# Patient Record
Sex: Female | Born: 1950
Health system: Southern US, Community
[De-identification: ages and names within clinical notes are randomized; demographics above are authoritative.]

## PROBLEM LIST (undated history)

## (undated) DIAGNOSIS — G4730 Sleep apnea, unspecified: Secondary | ICD-10-CM

## (undated) DIAGNOSIS — T7840XA Allergy, unspecified, initial encounter: Secondary | ICD-10-CM

## (undated) DIAGNOSIS — I10 Essential (primary) hypertension: Secondary | ICD-10-CM

## (undated) DIAGNOSIS — D649 Anemia, unspecified: Secondary | ICD-10-CM

## (undated) DIAGNOSIS — E119 Type 2 diabetes mellitus without complications: Secondary | ICD-10-CM

## (undated) DIAGNOSIS — E785 Hyperlipidemia, unspecified: Secondary | ICD-10-CM

## (undated) HISTORY — DX: Allergy, unspecified, initial encounter: T78.40XA

## (undated) HISTORY — DX: Sleep apnea, unspecified: G47.30

## (undated) HISTORY — DX: Essential (primary) hypertension: I10

## (undated) HISTORY — DX: Anemia, unspecified: D64.9

## (undated) HISTORY — DX: Type 2 diabetes mellitus without complications: E11.9

## (undated) HISTORY — DX: Hyperlipidemia, unspecified: E78.5

## (undated) HISTORY — PX: OTHER SURGICAL HISTORY: SHX169

## (undated) HISTORY — PX: ABDOMINAL HYSTERECTOMY: SHX81

---

## 2001-03-03 ENCOUNTER — Emergency Department (HOSPITAL_COMMUNITY): Admission: EM | Admit: 2001-03-03 | Discharge: 2001-03-03 | Payer: Self-pay | Admitting: *Deleted

## 2001-11-14 ENCOUNTER — Ambulatory Visit (HOSPITAL_COMMUNITY): Admission: RE | Admit: 2001-11-14 | Discharge: 2001-11-14 | Payer: Self-pay | Admitting: General Surgery

## 2001-11-14 ENCOUNTER — Encounter: Payer: Self-pay | Admitting: General Surgery

## 2001-11-27 ENCOUNTER — Encounter: Payer: Self-pay | Admitting: General Surgery

## 2001-11-27 ENCOUNTER — Ambulatory Visit (HOSPITAL_COMMUNITY): Admission: RE | Admit: 2001-11-27 | Discharge: 2001-11-27 | Payer: Self-pay | Admitting: General Surgery

## 2002-04-19 ENCOUNTER — Encounter: Payer: Self-pay | Admitting: General Surgery

## 2002-04-19 ENCOUNTER — Ambulatory Visit (HOSPITAL_COMMUNITY): Admission: RE | Admit: 2002-04-19 | Discharge: 2002-04-19 | Payer: Self-pay | Admitting: General Surgery

## 2003-08-15 ENCOUNTER — Inpatient Hospital Stay (HOSPITAL_COMMUNITY): Admission: RE | Admit: 2003-08-15 | Discharge: 2003-08-18 | Payer: Self-pay | Admitting: General Surgery

## 2003-08-18 ENCOUNTER — Encounter (INDEPENDENT_AMBULATORY_CARE_PROVIDER_SITE_OTHER): Payer: Self-pay | Admitting: Internal Medicine

## 2003-09-02 ENCOUNTER — Ambulatory Visit (HOSPITAL_COMMUNITY): Admission: RE | Admit: 2003-09-02 | Discharge: 2003-09-02 | Payer: Self-pay | Admitting: General Surgery

## 2003-09-08 ENCOUNTER — Encounter (INDEPENDENT_AMBULATORY_CARE_PROVIDER_SITE_OTHER): Payer: Self-pay | Admitting: Internal Medicine

## 2003-09-08 ENCOUNTER — Ambulatory Visit (HOSPITAL_COMMUNITY): Admission: RE | Admit: 2003-09-08 | Discharge: 2003-09-08 | Payer: Self-pay | Admitting: General Surgery

## 2003-10-10 ENCOUNTER — Encounter (INDEPENDENT_AMBULATORY_CARE_PROVIDER_SITE_OTHER): Payer: Self-pay | Admitting: Internal Medicine

## 2004-05-09 ENCOUNTER — Emergency Department (HOSPITAL_COMMUNITY): Admission: EM | Admit: 2004-05-09 | Discharge: 2004-05-09 | Payer: Self-pay | Admitting: Emergency Medicine

## 2005-05-05 ENCOUNTER — Encounter (INDEPENDENT_AMBULATORY_CARE_PROVIDER_SITE_OTHER): Payer: Self-pay | Admitting: Internal Medicine

## 2005-05-05 ENCOUNTER — Ambulatory Visit (HOSPITAL_COMMUNITY): Admission: RE | Admit: 2005-05-05 | Discharge: 2005-05-05 | Payer: Self-pay | Admitting: General Surgery

## 2005-06-01 ENCOUNTER — Encounter (INDEPENDENT_AMBULATORY_CARE_PROVIDER_SITE_OTHER): Payer: Self-pay | Admitting: Internal Medicine

## 2005-06-01 LAB — CONVERTED CEMR LAB
Blood Glucose, Fasting: 115 mg/dL
Pap Smear: NORMAL

## 2005-06-20 ENCOUNTER — Encounter (INDEPENDENT_AMBULATORY_CARE_PROVIDER_SITE_OTHER): Payer: Self-pay | Admitting: Internal Medicine

## 2005-08-29 ENCOUNTER — Encounter (HOSPITAL_COMMUNITY): Admission: RE | Admit: 2005-08-29 | Discharge: 2005-09-28 | Payer: Self-pay | Admitting: General Surgery

## 2005-08-29 ENCOUNTER — Encounter (INDEPENDENT_AMBULATORY_CARE_PROVIDER_SITE_OTHER): Payer: Self-pay | Admitting: Internal Medicine

## 2005-09-18 ENCOUNTER — Emergency Department (HOSPITAL_COMMUNITY): Admission: EM | Admit: 2005-09-18 | Discharge: 2005-09-18 | Payer: Self-pay | Admitting: Emergency Medicine

## 2006-04-19 ENCOUNTER — Ambulatory Visit: Payer: Self-pay | Admitting: Internal Medicine

## 2006-05-17 ENCOUNTER — Ambulatory Visit: Payer: Self-pay | Admitting: Internal Medicine

## 2006-05-17 DIAGNOSIS — N6019 Diffuse cystic mastopathy of unspecified breast: Secondary | ICD-10-CM

## 2006-05-18 ENCOUNTER — Encounter (INDEPENDENT_AMBULATORY_CARE_PROVIDER_SITE_OTHER): Payer: Self-pay | Admitting: Internal Medicine

## 2006-05-23 ENCOUNTER — Encounter (INDEPENDENT_AMBULATORY_CARE_PROVIDER_SITE_OTHER): Payer: Self-pay | Admitting: Internal Medicine

## 2006-05-23 ENCOUNTER — Ambulatory Visit (HOSPITAL_COMMUNITY): Admission: RE | Admit: 2006-05-23 | Discharge: 2006-05-23 | Payer: Self-pay | Admitting: Internal Medicine

## 2006-06-01 ENCOUNTER — Encounter (INDEPENDENT_AMBULATORY_CARE_PROVIDER_SITE_OTHER): Payer: Self-pay | Admitting: Internal Medicine

## 2006-06-02 ENCOUNTER — Encounter (INDEPENDENT_AMBULATORY_CARE_PROVIDER_SITE_OTHER): Payer: Self-pay | Admitting: Internal Medicine

## 2006-06-12 ENCOUNTER — Telehealth (INDEPENDENT_AMBULATORY_CARE_PROVIDER_SITE_OTHER): Payer: Self-pay | Admitting: Internal Medicine

## 2006-06-12 ENCOUNTER — Encounter (INDEPENDENT_AMBULATORY_CARE_PROVIDER_SITE_OTHER): Payer: Self-pay | Admitting: Internal Medicine

## 2006-06-15 ENCOUNTER — Ambulatory Visit (HOSPITAL_COMMUNITY): Admission: RE | Admit: 2006-06-15 | Discharge: 2006-06-15 | Payer: Self-pay | Admitting: Internal Medicine

## 2006-06-15 ENCOUNTER — Encounter (INDEPENDENT_AMBULATORY_CARE_PROVIDER_SITE_OTHER): Payer: Self-pay | Admitting: Internal Medicine

## 2006-06-20 ENCOUNTER — Encounter: Payer: Self-pay | Admitting: Internal Medicine

## 2006-06-20 DIAGNOSIS — M545 Low back pain: Secondary | ICD-10-CM

## 2006-06-20 DIAGNOSIS — R32 Unspecified urinary incontinence: Secondary | ICD-10-CM

## 2006-06-20 DIAGNOSIS — M129 Arthropathy, unspecified: Secondary | ICD-10-CM | POA: Insufficient documentation

## 2006-06-20 DIAGNOSIS — J309 Allergic rhinitis, unspecified: Secondary | ICD-10-CM

## 2006-07-05 ENCOUNTER — Ambulatory Visit: Payer: Self-pay | Admitting: Internal Medicine

## 2006-07-05 ENCOUNTER — Other Ambulatory Visit: Admission: RE | Admit: 2006-07-05 | Discharge: 2006-07-05 | Payer: Self-pay | Admitting: Internal Medicine

## 2006-07-05 ENCOUNTER — Encounter (INDEPENDENT_AMBULATORY_CARE_PROVIDER_SITE_OTHER): Payer: Self-pay | Admitting: Specialist

## 2006-07-05 DIAGNOSIS — M8580 Other specified disorders of bone density and structure, unspecified site: Secondary | ICD-10-CM | POA: Insufficient documentation

## 2006-07-05 DIAGNOSIS — M810 Age-related osteoporosis without current pathological fracture: Secondary | ICD-10-CM

## 2006-07-19 ENCOUNTER — Ambulatory Visit: Payer: Self-pay | Admitting: Internal Medicine

## 2006-07-21 ENCOUNTER — Encounter (INDEPENDENT_AMBULATORY_CARE_PROVIDER_SITE_OTHER): Payer: Self-pay | Admitting: Internal Medicine

## 2006-08-02 ENCOUNTER — Ambulatory Visit: Payer: Self-pay | Admitting: Internal Medicine

## 2006-08-03 ENCOUNTER — Inpatient Hospital Stay (HOSPITAL_COMMUNITY): Admission: EM | Admit: 2006-08-03 | Discharge: 2006-08-05 | Payer: Self-pay | Admitting: Emergency Medicine

## 2006-08-07 ENCOUNTER — Ambulatory Visit: Payer: Self-pay | Admitting: Internal Medicine

## 2006-08-07 ENCOUNTER — Telehealth (INDEPENDENT_AMBULATORY_CARE_PROVIDER_SITE_OTHER): Payer: Self-pay | Admitting: Internal Medicine

## 2006-08-16 ENCOUNTER — Ambulatory Visit: Payer: Self-pay | Admitting: Internal Medicine

## 2006-08-29 ENCOUNTER — Encounter (INDEPENDENT_AMBULATORY_CARE_PROVIDER_SITE_OTHER): Payer: Self-pay | Admitting: Internal Medicine

## 2006-09-21 ENCOUNTER — Ambulatory Visit: Payer: Self-pay | Admitting: Internal Medicine

## 2006-09-29 ENCOUNTER — Encounter (INDEPENDENT_AMBULATORY_CARE_PROVIDER_SITE_OTHER): Payer: Self-pay | Admitting: Internal Medicine

## 2006-11-03 ENCOUNTER — Ambulatory Visit: Payer: Self-pay | Admitting: Internal Medicine

## 2006-11-03 DIAGNOSIS — E210 Primary hyperparathyroidism: Secondary | ICD-10-CM | POA: Insufficient documentation

## 2006-11-04 ENCOUNTER — Encounter (INDEPENDENT_AMBULATORY_CARE_PROVIDER_SITE_OTHER): Payer: Self-pay | Admitting: Internal Medicine

## 2006-11-08 ENCOUNTER — Encounter (INDEPENDENT_AMBULATORY_CARE_PROVIDER_SITE_OTHER): Payer: Self-pay | Admitting: Internal Medicine

## 2006-11-21 LAB — CONVERTED CEMR LAB
BUN: 10 mg/dL (ref 6–23)
CO2: 25 meq/L (ref 19–32)
Chloride: 110 meq/L (ref 96–112)
Creatinine, Ser: 0.83 mg/dL (ref 0.40–1.20)
Glucose, Bld: 105 mg/dL — ABNORMAL HIGH (ref 70–99)
Potassium: 4.2 meq/L (ref 3.5–5.3)

## 2006-12-06 ENCOUNTER — Encounter (INDEPENDENT_AMBULATORY_CARE_PROVIDER_SITE_OTHER): Payer: Self-pay | Admitting: Internal Medicine

## 2006-12-06 ENCOUNTER — Telehealth (INDEPENDENT_AMBULATORY_CARE_PROVIDER_SITE_OTHER): Payer: Self-pay | Admitting: *Deleted

## 2006-12-07 ENCOUNTER — Encounter (INDEPENDENT_AMBULATORY_CARE_PROVIDER_SITE_OTHER): Payer: Self-pay | Admitting: Internal Medicine

## 2006-12-22 ENCOUNTER — Ambulatory Visit: Payer: Self-pay | Admitting: Internal Medicine

## 2006-12-22 DIAGNOSIS — R5381 Other malaise: Secondary | ICD-10-CM | POA: Insufficient documentation

## 2006-12-22 DIAGNOSIS — R5383 Other fatigue: Secondary | ICD-10-CM

## 2006-12-28 ENCOUNTER — Encounter (INDEPENDENT_AMBULATORY_CARE_PROVIDER_SITE_OTHER): Payer: Self-pay | Admitting: Internal Medicine

## 2006-12-28 LAB — CONVERTED CEMR LAB
BUN: 10 mg/dL (ref 6–23)
Basophils Relative: 0 % (ref 0–1)
Creatinine, Ser: 0.73 mg/dL (ref 0.40–1.20)
Eosinophils Absolute: 0.2 10*3/uL (ref 0.0–0.7)
Glucose, Bld: 109 mg/dL — ABNORMAL HIGH (ref 70–99)
HCT: 35.7 % — ABNORMAL LOW (ref 36.0–46.0)
Hemoglobin: 12.1 g/dL (ref 12.0–15.0)
Lymphs Abs: 2.8 10*3/uL (ref 0.7–3.3)
MCHC: 34 g/dL (ref 30.0–36.0)
MCV: 83.4 fL (ref 78.0–100.0)
Monocytes Absolute: 0.4 10*3/uL (ref 0.2–0.7)
Monocytes Relative: 7 % (ref 3–11)
Potassium: 4 meq/L (ref 3.5–5.3)
RBC: 4.28 M/uL (ref 3.87–5.11)
TSH: 1.581 microintl units/mL (ref 0.350–5.50)
WBC: 5.7 10*3/uL (ref 4.0–10.5)

## 2007-02-15 ENCOUNTER — Encounter (HOSPITAL_COMMUNITY): Admission: RE | Admit: 2007-02-15 | Discharge: 2007-03-17 | Payer: Self-pay | Admitting: Otolaryngology

## 2007-03-06 ENCOUNTER — Emergency Department (HOSPITAL_COMMUNITY): Admission: EM | Admit: 2007-03-06 | Discharge: 2007-03-06 | Payer: Self-pay | Admitting: *Deleted

## 2007-03-08 ENCOUNTER — Encounter (INDEPENDENT_AMBULATORY_CARE_PROVIDER_SITE_OTHER): Payer: Self-pay | Admitting: Internal Medicine

## 2007-03-12 ENCOUNTER — Ambulatory Visit: Payer: Self-pay | Admitting: Internal Medicine

## 2007-03-12 DIAGNOSIS — D649 Anemia, unspecified: Secondary | ICD-10-CM

## 2007-03-12 LAB — CONVERTED CEMR LAB
Blood Glucose, AC Bkfst: 120 mg/dL
Ferritin: 186 ng/mL (ref 10–291)
TIBC: 295 ug/dL (ref 250–470)
UIBC: 251 ug/dL
Vitamin B-12: 444 pg/mL (ref 211–911)

## 2007-03-21 ENCOUNTER — Ambulatory Visit: Payer: Self-pay | Admitting: Internal Medicine

## 2007-04-04 ENCOUNTER — Encounter (INDEPENDENT_AMBULATORY_CARE_PROVIDER_SITE_OTHER): Payer: Self-pay | Admitting: Internal Medicine

## 2007-04-16 ENCOUNTER — Encounter (INDEPENDENT_AMBULATORY_CARE_PROVIDER_SITE_OTHER): Payer: Self-pay | Admitting: Internal Medicine

## 2007-04-20 ENCOUNTER — Ambulatory Visit: Payer: Self-pay | Admitting: Internal Medicine

## 2007-04-20 ENCOUNTER — Telehealth (INDEPENDENT_AMBULATORY_CARE_PROVIDER_SITE_OTHER): Payer: Self-pay | Admitting: *Deleted

## 2007-04-20 DIAGNOSIS — G4733 Obstructive sleep apnea (adult) (pediatric): Secondary | ICD-10-CM

## 2007-04-23 ENCOUNTER — Encounter (INDEPENDENT_AMBULATORY_CARE_PROVIDER_SITE_OTHER): Payer: Self-pay | Admitting: Internal Medicine

## 2007-04-23 ENCOUNTER — Telehealth (INDEPENDENT_AMBULATORY_CARE_PROVIDER_SITE_OTHER): Payer: Self-pay | Admitting: *Deleted

## 2007-04-24 ENCOUNTER — Ambulatory Visit (HOSPITAL_COMMUNITY): Admission: RE | Admit: 2007-04-24 | Discharge: 2007-04-24 | Payer: Self-pay | Admitting: Internal Medicine

## 2007-04-26 ENCOUNTER — Telehealth (INDEPENDENT_AMBULATORY_CARE_PROVIDER_SITE_OTHER): Payer: Self-pay | Admitting: Internal Medicine

## 2007-05-01 ENCOUNTER — Encounter (INDEPENDENT_AMBULATORY_CARE_PROVIDER_SITE_OTHER): Payer: Self-pay | Admitting: Internal Medicine

## 2007-05-02 ENCOUNTER — Telehealth (INDEPENDENT_AMBULATORY_CARE_PROVIDER_SITE_OTHER): Payer: Self-pay | Admitting: *Deleted

## 2007-05-09 ENCOUNTER — Encounter: Admission: RE | Admit: 2007-05-09 | Discharge: 2007-05-09 | Payer: Self-pay | Admitting: Internal Medicine

## 2007-05-21 ENCOUNTER — Ambulatory Visit: Payer: Self-pay | Admitting: Internal Medicine

## 2007-05-21 ENCOUNTER — Telehealth (INDEPENDENT_AMBULATORY_CARE_PROVIDER_SITE_OTHER): Payer: Self-pay | Admitting: *Deleted

## 2007-05-22 ENCOUNTER — Ambulatory Visit (HOSPITAL_COMMUNITY): Admission: RE | Admit: 2007-05-22 | Discharge: 2007-05-22 | Payer: Self-pay | Admitting: Otolaryngology

## 2007-05-23 ENCOUNTER — Encounter: Admission: RE | Admit: 2007-05-23 | Discharge: 2007-05-23 | Payer: Self-pay | Admitting: Internal Medicine

## 2007-05-24 ENCOUNTER — Encounter (INDEPENDENT_AMBULATORY_CARE_PROVIDER_SITE_OTHER): Payer: Self-pay | Admitting: Internal Medicine

## 2007-05-25 ENCOUNTER — Telehealth (INDEPENDENT_AMBULATORY_CARE_PROVIDER_SITE_OTHER): Payer: Self-pay | Admitting: *Deleted

## 2007-05-25 LAB — CONVERTED CEMR LAB: Glucose, Fasting: 135 mg/dL — ABNORMAL HIGH (ref 70–99)

## 2007-05-28 ENCOUNTER — Ambulatory Visit (HOSPITAL_COMMUNITY): Admission: RE | Admit: 2007-05-28 | Discharge: 2007-05-28 | Payer: Self-pay | Admitting: Internal Medicine

## 2007-05-28 ENCOUNTER — Encounter (INDEPENDENT_AMBULATORY_CARE_PROVIDER_SITE_OTHER): Payer: Self-pay | Admitting: Internal Medicine

## 2007-06-05 ENCOUNTER — Ambulatory Visit: Payer: Self-pay | Admitting: Internal Medicine

## 2007-06-05 DIAGNOSIS — R519 Headache, unspecified: Secondary | ICD-10-CM | POA: Insufficient documentation

## 2007-06-05 DIAGNOSIS — R51 Headache: Secondary | ICD-10-CM | POA: Insufficient documentation

## 2007-06-05 LAB — CONVERTED CEMR LAB: Blood Glucose, AC Bkfst: 106 mg/dL

## 2007-06-06 ENCOUNTER — Telehealth (INDEPENDENT_AMBULATORY_CARE_PROVIDER_SITE_OTHER): Payer: Self-pay | Admitting: *Deleted

## 2007-06-06 LAB — CONVERTED CEMR LAB: Microalb, Ur: 0.49 mg/dL (ref 0.00–1.89)

## 2007-06-26 ENCOUNTER — Encounter (INDEPENDENT_AMBULATORY_CARE_PROVIDER_SITE_OTHER): Payer: Self-pay | Admitting: Internal Medicine

## 2007-06-27 ENCOUNTER — Ambulatory Visit (HOSPITAL_COMMUNITY): Admission: RE | Admit: 2007-06-27 | Discharge: 2007-06-28 | Payer: Self-pay | Admitting: Otolaryngology

## 2007-06-27 ENCOUNTER — Encounter (INDEPENDENT_AMBULATORY_CARE_PROVIDER_SITE_OTHER): Payer: Self-pay | Admitting: Otolaryngology

## 2007-07-20 ENCOUNTER — Ambulatory Visit: Payer: Self-pay | Admitting: Internal Medicine

## 2007-07-23 ENCOUNTER — Encounter (INDEPENDENT_AMBULATORY_CARE_PROVIDER_SITE_OTHER): Payer: Self-pay | Admitting: Internal Medicine

## 2007-07-23 ENCOUNTER — Telehealth (INDEPENDENT_AMBULATORY_CARE_PROVIDER_SITE_OTHER): Payer: Self-pay | Admitting: *Deleted

## 2007-07-23 LAB — CONVERTED CEMR LAB
BUN: 13 mg/dL (ref 6–23)
CO2: 24 meq/L (ref 19–32)
Glucose, Bld: 147 mg/dL — ABNORMAL HIGH (ref 70–99)
Potassium: 4.3 meq/L (ref 3.5–5.3)
Sodium: 143 meq/L (ref 135–145)

## 2007-08-02 ENCOUNTER — Encounter: Payer: Self-pay | Admitting: Family Medicine

## 2007-09-04 ENCOUNTER — Emergency Department (HOSPITAL_COMMUNITY): Admission: EM | Admit: 2007-09-04 | Discharge: 2007-09-04 | Payer: Self-pay | Admitting: Emergency Medicine

## 2007-09-05 ENCOUNTER — Ambulatory Visit: Payer: Self-pay | Admitting: Internal Medicine

## 2007-09-05 LAB — CONVERTED CEMR LAB
Alkaline Phosphatase: 66 units/L (ref 39–117)
CO2: 30 meq/L (ref 19–32)
Creatinine, Ser: 0.81 mg/dL (ref 0.40–1.20)
Glucose, Bld: 200 mg/dL — ABNORMAL HIGH (ref 70–99)
Sodium: 140 meq/L (ref 135–145)
Total Bilirubin: 0.6 mg/dL (ref 0.3–1.2)

## 2007-09-06 ENCOUNTER — Telehealth (INDEPENDENT_AMBULATORY_CARE_PROVIDER_SITE_OTHER): Payer: Self-pay | Admitting: *Deleted

## 2007-09-20 ENCOUNTER — Telehealth (INDEPENDENT_AMBULATORY_CARE_PROVIDER_SITE_OTHER): Payer: Self-pay | Admitting: *Deleted

## 2007-09-20 ENCOUNTER — Ambulatory Visit: Payer: Self-pay | Admitting: Internal Medicine

## 2007-09-20 LAB — CONVERTED CEMR LAB
Blood Glucose, Fingerstick: 89
Hgb A1c MFr Bld: 8.5 %

## 2007-10-05 ENCOUNTER — Encounter (INDEPENDENT_AMBULATORY_CARE_PROVIDER_SITE_OTHER): Payer: Self-pay | Admitting: Internal Medicine

## 2007-11-07 ENCOUNTER — Encounter (INDEPENDENT_AMBULATORY_CARE_PROVIDER_SITE_OTHER): Payer: Self-pay | Admitting: Internal Medicine

## 2007-11-08 ENCOUNTER — Telehealth (INDEPENDENT_AMBULATORY_CARE_PROVIDER_SITE_OTHER): Payer: Self-pay | Admitting: *Deleted

## 2007-11-08 ENCOUNTER — Encounter (INDEPENDENT_AMBULATORY_CARE_PROVIDER_SITE_OTHER): Payer: Self-pay | Admitting: Internal Medicine

## 2007-11-29 ENCOUNTER — Ambulatory Visit: Payer: Self-pay | Admitting: Internal Medicine

## 2007-12-03 ENCOUNTER — Encounter (INDEPENDENT_AMBULATORY_CARE_PROVIDER_SITE_OTHER): Payer: Self-pay | Admitting: Internal Medicine

## 2007-12-20 ENCOUNTER — Ambulatory Visit: Payer: Self-pay | Admitting: Internal Medicine

## 2007-12-20 LAB — CONVERTED CEMR LAB
Blood Glucose, Fingerstick: 100
Hgb A1c MFr Bld: 8.6 %

## 2008-02-22 ENCOUNTER — Encounter (INDEPENDENT_AMBULATORY_CARE_PROVIDER_SITE_OTHER): Payer: Self-pay | Admitting: Internal Medicine

## 2008-03-03 ENCOUNTER — Encounter (INDEPENDENT_AMBULATORY_CARE_PROVIDER_SITE_OTHER): Payer: Self-pay | Admitting: Internal Medicine

## 2008-03-06 ENCOUNTER — Encounter (INDEPENDENT_AMBULATORY_CARE_PROVIDER_SITE_OTHER): Payer: Self-pay | Admitting: Internal Medicine

## 2008-03-28 ENCOUNTER — Encounter (INDEPENDENT_AMBULATORY_CARE_PROVIDER_SITE_OTHER): Payer: Self-pay | Admitting: Internal Medicine

## 2008-06-05 ENCOUNTER — Ambulatory Visit (HOSPITAL_COMMUNITY): Admission: RE | Admit: 2008-06-05 | Discharge: 2008-06-05 | Payer: Self-pay | Admitting: Internal Medicine

## 2008-06-09 ENCOUNTER — Ambulatory Visit: Payer: Self-pay | Admitting: Internal Medicine

## 2008-06-09 DIAGNOSIS — R928 Other abnormal and inconclusive findings on diagnostic imaging of breast: Secondary | ICD-10-CM | POA: Insufficient documentation

## 2008-06-09 DIAGNOSIS — E119 Type 2 diabetes mellitus without complications: Secondary | ICD-10-CM

## 2008-06-09 LAB — CONVERTED CEMR LAB
Blood Glucose, Fingerstick: 131
Hgb A1c MFr Bld: 6.4 %

## 2008-06-10 LAB — CONVERTED CEMR LAB
ALT: 21 units/L (ref 0–35)
AST: 19 units/L (ref 0–37)
Basophils Absolute: 0 10*3/uL (ref 0.0–0.1)
Basophils Relative: 1 % (ref 0–1)
Calcium: 9.4 mg/dL (ref 8.4–10.5)
Chloride: 105 meq/L (ref 96–112)
Creatinine, Ser: 0.74 mg/dL (ref 0.40–1.20)
Hemoglobin: 12.1 g/dL (ref 12.0–15.0)
Lymphocytes Relative: 45 % (ref 12–46)
MCHC: 31.2 g/dL (ref 30.0–36.0)
Monocytes Absolute: 0.3 10*3/uL (ref 0.1–1.0)
Neutro Abs: 2.2 10*3/uL (ref 1.7–7.7)
Neutrophils Relative %: 44 % (ref 43–77)
Platelets: 358 10*3/uL (ref 150–400)
RDW: 13.1 % (ref 11.5–15.5)
Sodium: 142 meq/L (ref 135–145)
Total CHOL/HDL Ratio: 5.1
Total Protein: 7.7 g/dL (ref 6.0–8.3)
VLDL: 49 mg/dL — ABNORMAL HIGH (ref 0–40)

## 2008-06-16 ENCOUNTER — Encounter (INDEPENDENT_AMBULATORY_CARE_PROVIDER_SITE_OTHER): Payer: Self-pay | Admitting: Internal Medicine

## 2008-06-17 ENCOUNTER — Encounter: Admission: RE | Admit: 2008-06-17 | Discharge: 2008-06-17 | Payer: Self-pay | Admitting: Internal Medicine

## 2008-06-17 ENCOUNTER — Encounter (INDEPENDENT_AMBULATORY_CARE_PROVIDER_SITE_OTHER): Payer: Self-pay | Admitting: Internal Medicine

## 2008-06-23 ENCOUNTER — Ambulatory Visit: Payer: Self-pay | Admitting: Internal Medicine

## 2008-06-30 ENCOUNTER — Ambulatory Visit: Payer: Self-pay | Admitting: Internal Medicine

## 2008-07-03 ENCOUNTER — Encounter (INDEPENDENT_AMBULATORY_CARE_PROVIDER_SITE_OTHER): Payer: Self-pay | Admitting: Internal Medicine

## 2008-09-08 ENCOUNTER — Ambulatory Visit: Payer: Self-pay | Admitting: Internal Medicine

## 2008-09-08 LAB — CONVERTED CEMR LAB
Blood Glucose, Fingerstick: 136
Hgb A1c MFr Bld: 6.5 %

## 2008-12-08 ENCOUNTER — Ambulatory Visit: Payer: Self-pay | Admitting: Internal Medicine

## 2008-12-08 DIAGNOSIS — E785 Hyperlipidemia, unspecified: Secondary | ICD-10-CM | POA: Insufficient documentation

## 2008-12-08 LAB — CONVERTED CEMR LAB: Blood Glucose, Fingerstick: 101

## 2008-12-15 ENCOUNTER — Encounter (INDEPENDENT_AMBULATORY_CARE_PROVIDER_SITE_OTHER): Payer: Self-pay | Admitting: Internal Medicine

## 2008-12-16 LAB — CONVERTED CEMR LAB
Alkaline Phosphatase: 51 units/L (ref 39–117)
BUN: 10 mg/dL (ref 6–23)
Glucose, Bld: 109 mg/dL — ABNORMAL HIGH (ref 70–99)
HDL: 34 mg/dL — ABNORMAL LOW (ref >39)
LDL Cholesterol: 113 mg/dL — ABNORMAL HIGH (ref 0–99)
Sodium: 142 meq/L (ref 135–145)
Total Bilirubin: 0.4 mg/dL (ref 0.3–1.2)
Triglycerides: 145 mg/dL (ref <150)
VLDL: 29 mg/dL (ref 0–40)

## 2009-01-27 ENCOUNTER — Ambulatory Visit: Payer: Self-pay | Admitting: Internal Medicine

## 2009-01-28 ENCOUNTER — Encounter (INDEPENDENT_AMBULATORY_CARE_PROVIDER_SITE_OTHER): Payer: Self-pay | Admitting: Internal Medicine

## 2009-01-28 LAB — CONVERTED CEMR LAB
Albumin: 4.3 g/dL (ref 3.5–5.2)
Total Protein: 7.6 g/dL (ref 6.0–8.3)

## 2009-04-14 ENCOUNTER — Encounter (INDEPENDENT_AMBULATORY_CARE_PROVIDER_SITE_OTHER): Payer: Self-pay | Admitting: Internal Medicine

## 2009-05-13 ENCOUNTER — Emergency Department (HOSPITAL_COMMUNITY): Admission: EM | Admit: 2009-05-13 | Discharge: 2009-05-13 | Payer: Self-pay | Admitting: Emergency Medicine

## 2009-09-28 ENCOUNTER — Ambulatory Visit (HOSPITAL_COMMUNITY): Admission: RE | Admit: 2009-09-28 | Discharge: 2009-09-28 | Payer: Self-pay | Admitting: Internal Medicine

## 2010-08-21 ENCOUNTER — Encounter: Payer: Self-pay | Admitting: General Surgery

## 2010-08-22 ENCOUNTER — Encounter: Payer: Self-pay | Admitting: Internal Medicine

## 2010-08-22 ENCOUNTER — Encounter: Payer: Self-pay | Admitting: Otolaryngology

## 2010-08-29 LAB — CONVERTED CEMR LAB
Calcium: 10.5 mg/dL (ref 8.4–10.5)
Chloride: 105 meq/L (ref 96–112)
Cholesterol: 151 mg/dL (ref 0–200)
Creatinine, Ser: 0.82 mg/dL (ref 0.40–1.20)
HDL: 27 mg/dL — ABNORMAL LOW (ref >39)
LDL Cholesterol: 92 mg/dL (ref 0–99)
Triglycerides: 159 mg/dL — ABNORMAL HIGH (ref <150)

## 2010-08-31 NOTE — Letter (Signed)
Summary: rmsa chart  rmsa chart   Imported By: Curtis Sites 04/30/2010 14:17:05  _____________________________________________________________________  External Attachment:    Type:   Image     Comment:   External Document

## 2010-12-14 NOTE — Op Note (Signed)
NAMEALJEAN, HORIUCHI           ACCOUNT NO.:  [id number]   MEDICAL RECORD NO.:  [id number]          PATIENT TYPE:  OIB   LOCATION:  5732                         FACILITY:  [facility]   PHYSICIAN:  Jefry H. Pollyann Kennedy, MD     DATE OF BIRTH:  March 20, 1951   DATE OF PROCEDURE:  06/27/2007  DATE OF DISCHARGE:                               OPERATIVE REPORT   PREOPERATIVE DIAGNOSIS:  Hyperparathyroidism.   POSTOPERATIVE DIAGNOSIS:  Hyperparathyroidism.   PROCEDURE:  Parathyroidectomy.   SURGEON:  Jefry H. Pollyann Kennedy, MD   ASSISTANT:  Karren Burly D. Jenne Pane, MD   General endotracheal anesthesia was used.   No complications.   BLOOD LOSS:  Minimal.   FINDINGS:  A normal appearing inferior parathyroid gland on the left and  an enlarged appearing gland just superior and posterior to this  measuring approximately 1.5 cm in greatest dimension.  Frozen section  evaluation of parathyroid tissue consistent with possible adenoma.   HISTORY:  This is a 60 year old lady found to have hypercalcemia and  hyperparathyroidism on imaging, was negative on Sestamibi scan but had  an enlarged parathyroid identified with MRI scan.  The risks, benefits,  alternatives, complications of the procedure were explained to the  patient who seemed to understand and agreed with surgery.   PROCEDURE:  The patient was taken to the operating room, placed on the  operating table in the supine position.  Following induction of general  endotracheal anesthesia, the neck was prepped and draped in the standard  fashion.  A lower cervical skin crease was used to make the incision and  then electrocautery was used to incise the skin and down through the  superficial fascia.  The subplatysmal flap was developed superiorly and  inferiorly.  The midline fascia was divided and the thyroid gland was  identified.  The gland was retracted towards the right side while  retracting the strap muscles off the gland to the left to expose the  posterior aspect of the gland.  The normal appearing parathyroid was  identified first and was preserved with its blood supply.  Continued  dissection superior and posterior to this revealed the enlarged gland.  This gland was carefully dissected out of its vascular attachments and  sent for frozen section evaluation.  The 4-0 silk ties were used for  hemostasis.  The recurrent nerve was not identified, was felt to be more  posterior and more medial to the dissection plane.  The wound was  irrigated with saline and closed in layers using 3-0 chromic on the  midline fascia and the platysmal layer and running 5-0 nylon on  the skin.  A 7-French round JP drain was left in the wound, exiting to  the right side of the incision, secured in place with a nylon suture.  The patient was then awakened from anesthesia, extubated and transferred  to recovery in stable condition.      Jefry H. Pollyann Kennedy, MD  Electronically Signed     JHR/MEDQ  D:  06/27/2007  T:  06/27/2007  Job:  [id number]

## 2010-12-17 NOTE — H&P (Signed)
NAME:  [name], [name]                     ACCOUNT NO.:  [id number]   MEDICAL RECORD NO.:  [id number]                   PATIENT TYPE:  AMB   LOCATION:  DAY                                  FACILITY:  [facility]   PHYSICIAN:  Dirk Dress. Katrinka Blazing, M.D.                DATE OF BIRTH:  [birth year]   DATE OF ADMISSION:  DATE OF DISCHARGE:                                HISTORY & PHYSICAL   HISTORY OF PRESENT ILLNESS:  This is a 60 year old menopausal female with no  history of bleeding times two years who has large uterine fibroids.  The  patient has had intermittent perimenopausal bleeding with severe pain.  Pelvic ultrasound has revealed an enlarged uterus with fibroids throughout  the uterine wall.  Ovaries were felt to be slightly atrophic.  Because of  perimenopausal bleeding she has opted for a hysterectomy.  She had been  advised to have a hysterectomy three years previously, but she decided to  wait.   PAST HISTORY:  The patient has been relatively healthy.  She has no major  illness.  She does have varicose veins and chronic low-back pain and leg  pain.   MEDICATIONS:  Ibuprofen p.r.n. and Allegra D p.r.n.   PAST SURGICAL HISTORY:  The patient has not had previous surgery.   SOCIAL HISTORY:  Social history reveals that she is employed.  She does not  drink, smoke or use drugs.   PHYSICAL EXAMINATION:  VITAL SIGNS:  On physical examination blood pressure  is 130/76, pulse 76, respirations 16 and weight 225 pounds.  HEENT:  Unremarkable.  NECK:  Neck is supple.  No JVD or bruit.  There is slight thyromegaly.  CHEST:  Chest is clear to auscultation.  No rales, rubs, rhonchi or wheezes.  HEART:  Regular rate and rhythm without murmur, gallop or rub.  BREASTS:  Breasts reveal bilateral dense breast tissue with prominent  nodularity on the right.  ABDOMEN:  Abdomen is soft and nontender.  PELVIC EXAMINATION:  BUS and external genitalia normal.  Vaginal vault  reveals no discharge and the  cervix is normal.  Uterus is enlarged with  fullness anteriorly, and mildly tender.  RECTAL:  Rectal is normal.  No masses.  Stool guaiac negative.  EXTREMITIES:  Prominent varicosities, which are mostly superficial  telangiectasias.  There is trace edema.   IMPRESSION:  1. Uterine fibroids with recurrent perimenopausal  bleeding.  2. Mild thyromegaly.  3. Varicose veins.   PLAN:  Total abdominal hysterectomy and bilateral salpingo-oophorectomy.     ___________________________________________                                         Dirk Dress Katrinka Blazing, M.D.   LCS/MEDQ  D:  08/14/2003  T:  08/15/2003  Job:  [id number]

## 2010-12-17 NOTE — Op Note (Signed)
NAME:  [name], [name]                     ACCOUNT NO.:  [id number]   MEDICAL RECORD NO.:  [id number]                   PATIENT TYPE:  AMB   LOCATION:  DAY                                  FACILITY:  [facility]   PHYSICIAN:  Dirk Dress. Katrinka Blazing, M.D.                DATE OF BIRTH:  29-Nov-1950   DATE OF PROCEDURE:  DATE OF DISCHARGE:                                 OPERATIVE REPORT   PREOPERATIVE DIAGNOSIS:  Dysfunctional uterine bleeding, uterine fibroids.   POSTOPERATIVE DIAGNOSIS:  Dysfunctional uterine bleeding, uterine fibroids.   PROCEDURE:  Total abdominal hysterectomy with bilateral salpingo-  oophorectomy.   SURGEON:  Dirk Dress. Katrinka Blazing, M.D.   DESCRIPTION:  Under general anesthesia the patient's abdomen was prepped and  draped in a sterile field.  The old lower midline incision was excised and  extended down through the fascia.  Examination revealed a moderately  enlarged nodular uterus with multiple intramural fibroids. The ovaries were  very shrunken and atrophic.  The small bowel and colon palpated normal.  The  gallbladder and liver palpated normal.  The abdomen was packed off.  The  round ligaments were isolated, tied with ligature of #0 Dexon and divided.   The infundibulopelvic ligaments were skeletonized and the tubo-ovarian  vascular complex was doubly clamped, divided, and controlled with ligature  of #0 Dexon.  The uterine vessels were doubly clamped, divided and  controlled with Heaney sutures of #0 Dexon.  This was continued bilaterally  down to the apex of the vagina.  The apex of the vagina was cross clamped  with Haney clamp x2, divided and the uterus was excised. The apex was then  closed with 2 Heaney sutures of #0 Dexon.  It was reenforced using running  locking #0 Biosyn.  Hemostasis was achieved.   The pelvis was reperitonealized with 2-0 Biosyn.  Irrigation was carried  out.  Sponge, needle, instrument and blade counts were verified as correct  x2 with sponge  counts being verified as correct x3. The viscera were placed  in anatomic position.  The fascia was closed with running #1 Prolene.  The  subcutaneous tissue closed with 2-0 Biosyn and the skin was closed with  staples.  A sterile dressing was placed.  The patient was awakened from  anesthesia, uneventfully, transferred to a bed, and taken to the  postanesthetic care unit for further monitoring.      ___________________________________________                                            Dirk Dress. Katrinka Blazing, M.D.   LCS/MEDQ  D:  08/15/2003  T:  08/15/2003  Job:  [id number]

## 2010-12-17 NOTE — Discharge Summary (Signed)
NAME:  [name], [name]                     ACCOUNT NO.:  [id number]   MEDICAL RECORD NO.:  [id number]                   PATIENT TYPE:  INP   LOCATION:  A319                                 FACILITY:  [facility]   PHYSICIAN:  Dirk Dress. Katrinka Blazing, M.D.                DATE OF BIRTH:  [birth year]   DATE OF ADMISSION:  08/15/2003  DATE OF DISCHARGE:  08/18/2003                                 DISCHARGE SUMMARY   DISCHARGE DIAGNOSES:  1. Dysfunctional uterine bleeding.  2. Leiomyoma uteri.  3. Varicose veins.  4. Thyromegaly.   SPECIAL PROCEDURE:  Total abdominal hysterectomy on January 14.   DISPOSITION:  The patient is discharged home in stable and satisfactory  condition.   DISCHARGE MEDICATIONS:  1. Tylox 1 or 2 q.4h. p.r.n. pain, Phenergan 25 mg q.4h. p.r.n. nausea.  2. Premarin 0.625 mg daily.  3. Milk of magnesia 30 cc b.i.d. p.r.n.  The patient is advised to return to the office in 2 weeks.   SUMMARY:  A 60 year old female who is postmenopausal for 2 years.  She  developed intermittent bleeding with severe pain. She was found to have an  enlarged uterus with fibroids throughout the uterine wall.  The patient had  been advised to have a hysterectomy 3 years previously because of severe  dysmenorrhea.  She finally opted to have a hysterectomy.   Past history reveals that she has been totally healthy with no major illness  and no chronic medications and no prior surgery.  She does not drink, smoke  or use drugs.   Pelvic exam was unremarkable except for an enlarged uterus.  With prominence  anteriorly and with prominent varicosities of both legs.  The patient was  admitted to Day Surgery and underwent TAH and BSO on January 14.  She had a  very benign postoperative course and was discharged home on the morning of  the third postoperative day in satisfactory condition.     ___________________________________________                                         Dirk Dress Katrinka Blazing, M.D.   LCS/MEDQ  D:  09/21/2003  T:  09/22/2003  Job:  [id number]

## 2010-12-17 NOTE — H&P (Signed)
[name], [name]           ACCOUNT NO.:  [id number]   MEDICAL RECORD NO.:  [id number]          PATIENT TYPE:  INP   LOCATION:  A319                          FACILITY:  [facility]   PHYSICIAN:  Marcello Moores, MD   DATE OF BIRTH:  01-21-1951   DATE OF ADMISSION:  08/03/2006  DATE OF DISCHARGE:  LH                              HISTORY & PHYSICAL   PRIMARY CARE PHYSICIAN:  Dr. Jen Mow.   CHIEF COMPLAINT:  Fever, arthralgias and headache since this morning.   HISTORY OF PRESENT ILLNESS:  [name] is a 60 year old African-  American female patient with no significant past medical history except  she has a high calcium level, probably secondary to hyperparathyroidism  and she was getting medication for that. She came to the emergency room  today with fever, headache and pain all over the body. She denied any  diarrhea, vomiting or nausea. She denied cough or chest pain. No urinary  complaints. Per the patient, she said she was perfectly normal yesterday  and she woke up this morning with this fever, headache and with  generalized body aches. She did not notice any new rash over the skin as  well.   ALLERGIES:  ALLERGIC TO SULFA DRUGS, per patient.   REVIEW OF SYSTEMS:  A 10 point review of systems is noncontributory  except as mentioned in the HPI.   PAST MEDICAL HISTORY:  Hypercalcemia probably secondary to  hyperparathyroidism.   SOCIAL HISTORY:  She lives with her son, her daughter and a grandson.  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 She denies any history of smoking,  intake of alcohol or any illegal drugs.   FAMILY HISTORY:  Noncontributory.   MEDICATIONS:  1. Tylenol today for her current problem.  2. She is taking medication for her hypercalcemia from Dr. Jen Mow (she      could not specify).   PHYSICAL EXAMINATION:  GENERAL: She is lying on the bed, sick looking.  Talking slowly. She is not in any form of distress.  VITAL SIGNS: Blood pressure is 136/69, temperature  is 102.4 and pulse is  96, respiratory rate is 16 per minute. Saturating 97% on room air.  HEENT: Slightly injected conjunctivae. Non icteric sclerae. Throat is  clear.  NECK: Supple, there is no sign of meningismus. There is no  lymphadenopathy detected.  CHEST: She has good air entry bilaterally. There is no crepitus or  rales.  CVS: S1, S2 were well heard, regular, no murmur or gallop appreciated.  ABDOMEN: Soft, there is no area of tenderness. No mass or organomegaly  detected.  GENITOURINARY: There is no abnormality.  EXTREMITIES: No edema. Peripheral pulses positive. No lesion or  cyanosis.  SKIN: She had old hyperemic skin lesion on the inguinal  are. There is  not any new rash.  MUSCULOSKELETAL: Normal.  CNS: She is alert and well oriented x 3. There are no meningeal  irritation signs. There is no sign of focal deficit.   LABORATORY DATA:  CBC: White blood cell count is 5.9 thousand,  hemoglobin is 12.5, hematocrit is 37.8 and platelet count is 341,000.  Chemistries: Glucose  is 99, sodium is 139, potassium is 3.7, chloride is  108 and bicarb is 29. BUN 4, creatinine 0.82. Calcium level is 11.1.   Chest x-ray is normal.   ASSESSMENT:  1. Fever, headache, and generalized arthralgia, most probably acute      viral syndrome.  2. Hypercalcemia, probably secondary to hyperparathyroidism.   PLAN:  Admit patient for further observation to make sure there is not  any bacterial origin for her current problem of fever and headache. Will  repeat CBC, will send urine and urine culture. Blood culture. Will  manage her headache, fever and arthralgia with supportive treatment;  will give IV fluid for rehydration of normal saline. Monitor vital signs  and will continue management according to the clinical response and lab  results.      Marcello Moores, MD  Electronically Signed     MT/MEDQ  D:  08/03/2006  T:  08/03/2006  Job:  [id number]   cc:   Erle Crocker, M.D.

## 2010-12-17 NOTE — Discharge Summary (Signed)
NAMEAPOLLONIA, AMINI           ACCOUNT NO.:  [id number]   MEDICAL RECORD NO.:  [id number]          PATIENT TYPE:  INP   LOCATION:  A319                          FACILITY:  [facility]   PHYSICIAN:  Margaretmary Dys, M.D.DATE OF BIRTH:  1950-11-26   DATE OF ADMISSION:  08/03/2006  DATE OF DISCHARGE:  01/05/2008LH                               DISCHARGE SUMMARY   PRIMARY CARE PHYSICIAN:  Erle Crocker, M.D.   DISCHARGE DIAGNOSES:  1. Acute viral syndrome.  2. History of hyperparathyroidism.  3. Probable drug reaction.   DIET:  Regular.   ACTIVITY:  As tolerated.   SPECIAL INSTRUCTIONS:  The patient is requested to follow up with Dr.  Jen Mow in the next 1-2 weeks for evaluation of possible drug side effect  from the antihypercalcemia drug that she received.   HOSPITAL COURSE:  Ms. [name] is a 60 year old African-American female  with no significant past medical history except for a high calcium level  secondary to hyperparathyroidism for which she was getting medications.  The patient came to the emergency room complaining of fever, headache,  and pain all over her body.  She denied any nausea or vomiting.  The  patient states she was fine the previous morning . She denied any rash.   Exam in the emergency room revealed a temperature of 102.4, pulse 96,  oxygen saturation 97% on room air.  The rest of the exam was  unremarkable.   LABORATORY DATA:  White blood cell count 5.9 hemoglobin 12.5, hematocrit  37.8, platelets 341,000,  Sodium 139, potassium 3.7, chloride 108,  bicarb 29, BUN 4, creatinine 0.82, calcium 11.1.  The patient was  subsequently admitted.  Blood cultures were negative.   She reported getting some medication for high calcium from Dr. Jen Mow, the  name of which was unclear.  There was extensive workup.  All blood  cultures came back negative.  Fever went away, and there were no  remarkable events during her hospitalization.  The patient was  aggressively  hydrated due to the high calcium level with subsequent  improvement in her calcium level prior to discharge.   Chest x-ray only revealed some bronchitic changes with bibasilar  atelectasis, otherwise was unremarkable.   When I saw the patient on August 05, 2006, she was doing much better  with calcium of 10, and I subsequently discharged her for followup with  Dr. Jen Mow.   FOLLOW UP:  Dr. Erle Crocker in the next 1-2 weeks.   A 12-lead EKG was normal      Margaretmary Dys, M.D.  Electronically Signed     AM/MEDQ  D:  09/09/2006  T:  09/09/2006  Job:  [id number]

## 2011-02-28 ENCOUNTER — Other Ambulatory Visit: Payer: Self-pay | Admitting: Obstetrics and Gynecology

## 2011-02-28 DIAGNOSIS — Z139 Encounter for screening, unspecified: Secondary | ICD-10-CM

## 2011-03-03 ENCOUNTER — Ambulatory Visit (HOSPITAL_COMMUNITY)
Admission: RE | Admit: 2011-03-03 | Discharge: 2011-03-03 | Disposition: A | Discharge status: Home / Self Care | Payer: Self-pay | Source: Ambulatory Visit | Attending: Obstetrics and Gynecology | Admitting: Obstetrics and Gynecology

## 2011-03-03 DIAGNOSIS — Z139 Encounter for screening, unspecified: Secondary | ICD-10-CM

## 2011-03-03 DIAGNOSIS — Z1231 Encounter for screening mammogram for malignant neoplasm of breast: Secondary | ICD-10-CM | POA: Insufficient documentation

## 2011-04-22 LAB — URINALYSIS, ROUTINE W REFLEX MICROSCOPIC
Bilirubin Urine: NEGATIVE
Ketones, ur: NEGATIVE
Nitrite: NEGATIVE
Specific Gravity, Urine: 1.025
Urobilinogen, UA: 0.2

## 2011-04-22 LAB — URINE MICROSCOPIC-ADD ON

## 2011-05-10 LAB — CBC
HCT: 37.4
Hemoglobin: 12.1
MCHC: 32.4
MCV: 86.2
RDW: 12.5

## 2011-05-16 LAB — CBC
HCT: 34.1 — ABNORMAL LOW
Hemoglobin: 11.4 — ABNORMAL LOW
MCV: 85.5
Platelets: 300
RDW: 12.6

## 2011-05-16 LAB — BASIC METABOLIC PANEL
BUN: 14
Chloride: 112
Glucose, Bld: 121 — ABNORMAL HIGH
Potassium: 3.5
Sodium: 142

## 2011-05-16 LAB — URINALYSIS, ROUTINE W REFLEX MICROSCOPIC
Bilirubin Urine: NEGATIVE
Glucose, UA: NEGATIVE
Hgb urine dipstick: NEGATIVE
pH: 5.5

## 2011-05-16 LAB — DIFFERENTIAL
Basophils Absolute: 0
Eosinophils Absolute: 0.2
Eosinophils Relative: 3
Lymphs Abs: 2.7
Monocytes Absolute: 0.4

## 2012-03-12 ENCOUNTER — Ambulatory Visit (INDEPENDENT_AMBULATORY_CARE_PROVIDER_SITE_OTHER): Payer: Self-pay | Admitting: Cardiovascular Disease

## 2012-03-12 ENCOUNTER — Encounter: Payer: Self-pay | Admitting: Cardiovascular Disease

## 2012-03-12 VITALS — BP 110/62 | HR 81 | Ht 66.0 in | Wt 225.0 lb

## 2012-03-12 DIAGNOSIS — E119 Type 2 diabetes mellitus without complications: Secondary | ICD-10-CM

## 2012-03-12 DIAGNOSIS — R030 Elevated blood-pressure reading, without diagnosis of hypertension: Secondary | ICD-10-CM

## 2012-03-12 DIAGNOSIS — R079 Chest pain, unspecified: Secondary | ICD-10-CM

## 2012-03-12 DIAGNOSIS — E785 Hyperlipidemia, unspecified: Secondary | ICD-10-CM

## 2012-03-12 NOTE — Assessment & Plan Note (Signed)
Cholesterol is at goal.  Continue current dose of statin and diet Rx.  No myalgias or side effects.  F/U  LFT's in 6 months. Lab Results  Component Value Date   LDLCALC 113* 12/15/2008

## 2012-03-12 NOTE — Assessment & Plan Note (Signed)
Discussed low carb diet.  Target hemoglobin A1c is 6.5 or less.  Continue current medications.  

## 2012-03-12 NOTE — Addendum Note (Signed)
**Note De-Identified [name] Obfuscation** Addended by: Demetrios Loll on: 03/12/2012 04:04 PM   Modules accepted: Orders

## 2012-03-12 NOTE — Patient Instructions (Addendum)
Your physician has requested that you have en exercise stress myoview. For further information please visit [email or web address] Please follow instruction sheet, as given.  Your physician recommends that you schedule a follow-up appointment in: we will contact you with results of your test.

## 2012-03-12 NOTE — Progress Notes (Signed)
Patient ID: [name], female   DOB: 29-Mar-1951, 61 y.o.   MRN: [id number] 61 yo referred by [facility] for Chest Pain and abnormal ECG.  CRF;s DM, HTN and elevated lipids.  Infrequent pains.  Not related to exertion.  Last minutes Not associated with dyspnea, palpitations or syncope.  No previous cardiac w/u or history of CAD.  She is under a lot of stress taking care of child with [place] and mother with Altzheimers.  Compliant with meds.    ROS: Denies fever, malais, weight loss, blurry vision, decreased visual acuity, cough, sputum, SOB, hemoptysis, pleuritic pain, palpitaitons, heartburn, abdominal pain, melena, lower extremity edema, claudication, or rash.  All other systems reviewed and negative   General: Affect appropriate Healthy:  appears stated age HEENT: normal Neck supple with no adenopathy JVP normal no bruits no thyromegaly Lungs clear with no wheezing and good diaphragmatic motion Heart:  S1/S2 no murmur,rub, gallop or click PMI normal Abdomen: benighn, BS positve, no tenderness, no AAA no bruit.  No HSM or HJR Distal pulses intact with no bruits No edema Neuro non-focal Skin warm and dry No muscular weakness  Medications Current Outpatient Prescriptions  Medication Sig Dispense Refill  . aspirin 81 MG tablet Take 81 mg by mouth daily.      . enalapril (VASOTEC) 10 MG tablet Take 10 mg by mouth daily.      . metFORMIN (GLUCOPHAGE) 500 MG tablet Take 500 mg by mouth 2 (two) times daily with a meal.      . pravastatin (PRAVACHOL) 40 MG tablet Take 40 mg by mouth daily.      Marland Kitchen terbinafine (LAMISIL) 250 MG tablet Take 250 mg by mouth daily.        Allergies Sulfonamide derivatives  Family History: No family history on file.  Social History: History   Social History  . Marital Status: Divorced    Spouse Name: N/A    Number of Children: N/A  . Years of Education: N/A   Occupational History  . Not on file.   Social History Main Topics    . Smoking status: Former Smoker -- 2 years    Types: Cigarettes    Quit date: 08/01/1986  . Smokeless tobacco: Not on file  . Alcohol Use: No  . Drug Use: No  . Sexually Active: Not on file   Other Topics Concern  . Not on file   Social History Narrative  . No narrative on file    Electrocardiogram:  NSR nonspecific ST/T wave changes PVC  Assessment and Plan

## 2012-03-12 NOTE — Assessment & Plan Note (Signed)
Well controlled.  Continue current medications and low sodium Dash type diet.    

## 2012-03-12 NOTE — Assessment & Plan Note (Signed)
Atypical but multiple CRF;s with Diabetes and abnormal ECG  F/U stress myovue

## 2012-03-21 ENCOUNTER — Other Ambulatory Visit (HOSPITAL_COMMUNITY): Payer: Self-pay | Admitting: Physician Assistant

## 2012-03-21 DIAGNOSIS — Z139 Encounter for screening, unspecified: Secondary | ICD-10-CM

## 2012-03-22 ENCOUNTER — Ambulatory Visit (INDEPENDENT_AMBULATORY_CARE_PROVIDER_SITE_OTHER): Payer: Self-pay

## 2012-03-22 ENCOUNTER — Encounter (HOSPITAL_COMMUNITY): Payer: Self-pay | Admitting: Cardiology

## 2012-03-22 ENCOUNTER — Encounter (HOSPITAL_COMMUNITY)
Admission: RE | Admit: 2012-03-22 | Discharge: 2012-03-22 | Disposition: A | Discharge status: Home / Self Care | Payer: Self-pay | Source: Ambulatory Visit | Attending: Cardiovascular Disease | Admitting: Cardiovascular Disease

## 2012-03-22 DIAGNOSIS — R079 Chest pain, unspecified: Secondary | ICD-10-CM

## 2012-03-22 DIAGNOSIS — R9431 Abnormal electrocardiogram [ECG] [EKG]: Secondary | ICD-10-CM | POA: Insufficient documentation

## 2012-03-22 MED ORDER — TECHNETIUM TC 99M TETROFOSMIN IV KIT
30.0000 | PACK | Freq: Once | INTRAVENOUS | Status: AC | PRN
  Administered 2012-03-22: 31 via INTRAVENOUS

## 2012-03-22 MED ORDER — TECHNETIUM TC 99M TETROFOSMIN IV KIT
10.0000 | PACK | Freq: Once | INTRAVENOUS | Status: AC | PRN
  Administered 2012-03-22: 10 via INTRAVENOUS

## 2012-03-22 NOTE — Progress Notes (Signed)
Stress Lab Nurses Notes - [name]  [name] 03/22/2012 Reason for doing test: Chest Pain Type of test: Stress Myoview Nurse performing test: Parke Poisson, RN Nuclear Medicine Tech: Dillard Essex Echo Tech: Not Applicable MD performing test: R. Rothbart Family MD: [facility] Test explained and consent signed: yes IV started: 22g jelco, Saline lock flushed, No redness or edema and Saline lock started in radiology Symptoms: Fatigue Treatment/Intervention: None Reason test stopped: fatigue and reached target HR After recovery IV was: Discontinued via X-ray tech and No redness or edema Patient to return to Nuc. Med at : 12:30 Patient discharged: Home Patient's Condition upon discharge was: stable Comments: During test peak BP 168/72 & HR 142.  Recovery BP 108/60 & HR 88.   Symptoms resolved in recovery. Erskine Speed T

## 2012-03-29 ENCOUNTER — Ambulatory Visit (HOSPITAL_COMMUNITY)
Admission: RE | Admit: 2012-03-29 | Discharge: 2012-03-29 | Disposition: A | Discharge status: Home / Self Care | Payer: Self-pay | Source: Ambulatory Visit | Attending: Physician Assistant | Admitting: Physician Assistant

## 2012-03-29 DIAGNOSIS — Z139 Encounter for screening, unspecified: Secondary | ICD-10-CM

## 2012-04-03 ENCOUNTER — Other Ambulatory Visit: Payer: Self-pay | Admitting: Physician Assistant

## 2012-04-03 DIAGNOSIS — R928 Other abnormal and inconclusive findings on diagnostic imaging of breast: Secondary | ICD-10-CM

## 2012-04-18 ENCOUNTER — Encounter (HOSPITAL_COMMUNITY): Payer: Self-pay

## 2012-05-02 ENCOUNTER — Ambulatory Visit (HOSPITAL_COMMUNITY)
Admission: RE | Admit: 2012-05-02 | Discharge: 2012-05-02 | Disposition: A | Discharge status: Home / Self Care | Payer: PRIVATE HEALTH INSURANCE | Source: Ambulatory Visit | Attending: Physician Assistant | Admitting: Physician Assistant

## 2012-05-02 DIAGNOSIS — R928 Other abnormal and inconclusive findings on diagnostic imaging of breast: Secondary | ICD-10-CM | POA: Insufficient documentation

## 2013-02-19 ENCOUNTER — Other Ambulatory Visit (HOSPITAL_COMMUNITY): Payer: Self-pay | Admitting: Physician Assistant

## 2013-02-19 DIAGNOSIS — Z139 Encounter for screening, unspecified: Secondary | ICD-10-CM

## 2013-05-06 ENCOUNTER — Ambulatory Visit (HOSPITAL_COMMUNITY)
Admission: RE | Admit: 2013-05-06 | Discharge: 2013-05-06 | Disposition: A | Discharge status: Home / Self Care | Payer: Self-pay | Source: Ambulatory Visit | Attending: Physician Assistant | Admitting: Physician Assistant

## 2013-05-06 DIAGNOSIS — Z139 Encounter for screening, unspecified: Secondary | ICD-10-CM | POA: Insufficient documentation

## 2014-06-18 ENCOUNTER — Other Ambulatory Visit (HOSPITAL_COMMUNITY): Payer: Self-pay | Admitting: Physician Assistant

## 2014-06-18 DIAGNOSIS — Z1231 Encounter for screening mammogram for malignant neoplasm of breast: Secondary | ICD-10-CM

## 2014-06-30 ENCOUNTER — Ambulatory Visit (HOSPITAL_COMMUNITY)
Admission: RE | Admit: 2014-06-30 | Discharge: 2014-06-30 | Disposition: A | Discharge status: Home / Self Care | Payer: Self-pay | Source: Ambulatory Visit | Attending: Physician Assistant | Admitting: Physician Assistant

## 2014-06-30 DIAGNOSIS — Z1231 Encounter for screening mammogram for malignant neoplasm of breast: Secondary | ICD-10-CM | POA: Insufficient documentation

## 2015-05-26 ENCOUNTER — Ambulatory Visit: Payer: Self-pay | Admitting: Physician Assistant

## 2015-06-03 ENCOUNTER — Ambulatory Visit: Payer: Self-pay | Admitting: Physician Assistant

## 2015-06-03 ENCOUNTER — Encounter: Payer: Self-pay | Admitting: Physician Assistant

## 2015-06-03 VITALS — BP 130/76 | HR 89 | Temp 97.9°F | Ht 67.0 in | Wt 227.0 lb

## 2015-06-03 DIAGNOSIS — E785 Hyperlipidemia, unspecified: Secondary | ICD-10-CM

## 2015-06-03 DIAGNOSIS — I10 Essential (primary) hypertension: Secondary | ICD-10-CM

## 2015-06-03 DIAGNOSIS — E1129 Type 2 diabetes mellitus with other diabetic kidney complication: Secondary | ICD-10-CM

## 2015-06-03 NOTE — Progress Notes (Signed)
BP 130/76 mmHg  Pulse 89  Temp(Src) 97.9 F (36.6 C)  Ht 5\' 7"  (1.702 m)  Wt 227 lb (102.967 kg)  BMI 35.55 kg/m2  SpO2 98%   Subjective:    Patient ID: [name], female    DOB: Apr 11, 1951, 64 y.o.   MRN: [id number]  HPI: [name] is a 64 y.o. female presenting on 06/03/2015 for Diabetes   HPI Chief Complaint  Patient presents with  . Diabetes   Pt says she is feeling well and has no complaints  Relevant past medical, surgical, family and social history reviewed and updated as indicated. Interim medical history since our last visit reviewed. Allergies and medications reviewed and updated.   Current outpatient prescriptions:  .  aspirin 81 MG tablet, Take 81 mg by mouth daily., Disp: , Rfl:  .  enalapril (VASOTEC) 5 MG tablet, Take 5 mg by mouth daily., Disp: , Rfl:  .  metFORMIN (GLUCOPHAGE) 500 MG tablet, Take 500 mg by mouth 2 (two) times daily with a meal. , Disp: , Rfl:  .  Omega-3 Fatty Acids (FISH OIL) 1000 MG CAPS, Take 2,000 mg by mouth 2 (two) times daily., Disp: , Rfl:  .  simvastatin (ZOCOR) 40 MG tablet, Take 40 mg by mouth daily., Disp: , Rfl:    Review of Systems  Constitutional: Negative for fever, chills, diaphoresis, appetite change, fatigue and unexpected weight change.  HENT: Positive for dental problem. Negative for congestion, drooling, ear pain, facial swelling, hearing loss, mouth sores, sneezing, sore throat, trouble swallowing and voice change.   Eyes: Negative for pain, discharge, redness, itching and visual disturbance.  Respiratory: Negative for cough, choking, shortness of breath and wheezing.   Cardiovascular: Negative for chest pain, palpitations and leg swelling.  Gastrointestinal: Positive for constipation. Negative for vomiting, abdominal pain, diarrhea and blood in stool.  Endocrine: Negative for cold intolerance, heat intolerance and polydipsia.  Genitourinary: Negative for dysuria, hematuria and decreased urine  volume.  Musculoskeletal: Negative for back pain, arthralgias and gait problem.  Skin: Negative for rash.  Allergic/Immunologic: Positive for environmental allergies.  Neurological: Negative for seizures, syncope, light-headedness and headaches.  Hematological: Negative for adenopathy.  Psychiatric/Behavioral: Negative for suicidal ideas, dysphoric mood and agitation. The patient is not nervous/anxious.     Per HPI unless specifically indicated above     Objective:    BP 130/76 mmHg  Pulse 89  Temp(Src) 97.9 F (36.6 C)  Ht 5\' 7"  (1.702 m)  Wt 227 lb (102.967 kg)  BMI 35.55 kg/m2  SpO2 98%  Wt Readings from Last 3 Encounters:  06/03/15 227 lb (102.967 kg)  03/12/12 225 lb (102.059 kg)  01/27/09 224 lb 4 oz (101.719 kg)    Physical Exam  Constitutional: She is oriented to person, place, and time. She appears well-developed and well-nourished.  HENT:  Head: Normocephalic and atraumatic.  Neck: Neck supple.  Cardiovascular: Normal rate and regular rhythm.   Pulmonary/Chest: Effort normal and breath sounds normal.  Abdominal: Soft. Bowel sounds are normal. She exhibits no mass. There is no tenderness.  Musculoskeletal: She exhibits no edema.  Lymphadenopathy:    She has no cervical adenopathy.  Neurological: She is alert and oriented to person, place, and time.  Skin: Skin is warm and dry.  Psychiatric: She has a normal mood and affect. Her behavior is normal.  Vitals reviewed.   Foot exam done    Assessment & Plan:   Encounter Diagnoses  Name Primary?  Marland Kitchen Type  2 diabetes mellitus with other kidney complication ([place]) Yes  . Essential hypertension, benign   . Hyperlipemia     -cont current meds -Get fasting labs drawn tomorrow morning.  Will call with results -F/u OV 3 mo. rto sooner prn

## 2015-06-16 LAB — COMPLETE METABOLIC PANEL WITH GFR
ALBUMIN: 4.1 g/dL (ref 3.6–5.1)
ALK PHOS: 54 U/L (ref 33–130)
ALT: 17 U/L (ref 6–29)
AST: 17 U/L (ref 10–35)
BILIRUBIN TOTAL: 0.4 mg/dL (ref 0.2–1.2)
BUN: 9 mg/dL (ref 7–25)
CALCIUM: 9.7 mg/dL (ref 8.6–10.4)
CO2: 33 mmol/L — ABNORMAL HIGH (ref 20–31)
Chloride: 104 mmol/L (ref 98–110)
Creat: 0.77 mg/dL (ref 0.50–0.99)
GFR, EST NON AFRICAN AMERICAN: 82 mL/min (ref >=60)
Glucose, Bld: 108 mg/dL — ABNORMAL HIGH (ref 65–99)
POTASSIUM: 4.3 mmol/L (ref 3.5–5.3)
Sodium: 142 mmol/L (ref 135–146)
Total Protein: 7.3 g/dL (ref 6.1–8.1)

## 2015-06-16 LAB — CBC
HEMATOCRIT: 37 % (ref 36.0–46.0)
Hemoglobin: 12.2 g/dL (ref 12.0–15.0)
MCH: 26.9 pg (ref 26.0–34.0)
MCHC: 33 g/dL (ref 30.0–36.0)
MCV: 81.7 fL (ref 78.0–100.0)
MPV: 9.2 fL (ref 8.6–12.4)
Platelets: 328 10*3/uL (ref 150–400)
RBC: 4.53 MIL/uL (ref 3.87–5.11)
RDW: 13.6 % (ref 11.5–15.5)
WBC: 5.4 10*3/uL (ref 4.0–10.5)

## 2015-06-16 LAB — LIPID PANEL
CHOL/HDL RATIO: 4.7 ratio (ref <=5.0)
CHOLESTEROL: 141 mg/dL (ref 125–200)
HDL: 30 mg/dL — AB (ref >=46)
LDL Cholesterol: 83 mg/dL (ref <130)
TRIGLYCERIDES: 139 mg/dL (ref <150)
VLDL: 28 mg/dL (ref <30)

## 2015-06-16 LAB — HEMOGLOBIN A1C
Hgb A1c MFr Bld: 7.1 % — ABNORMAL HIGH (ref <5.7)
MEAN PLASMA GLUCOSE: 157 mg/dL — AB (ref <117)

## 2015-06-16 LAB — MICROALBUMIN, URINE: MICROALB UR: 1.8 mg/dL

## 2015-07-18 ENCOUNTER — Other Ambulatory Visit: Payer: Self-pay | Admitting: Physician Assistant

## 2015-07-29 ENCOUNTER — Other Ambulatory Visit: Payer: Self-pay | Admitting: Physician Assistant

## 2015-07-29 MED ORDER — SIMVASTATIN 40 MG PO TABS: 40.0000 mg | ORAL_TABLET | Freq: Every day | ORAL | Status: DC

## 2015-09-03 ENCOUNTER — Ambulatory Visit: Payer: Self-pay | Admitting: Physician Assistant

## 2015-09-03 ENCOUNTER — Encounter: Payer: Self-pay | Admitting: Physician Assistant

## 2015-09-03 VITALS — BP 136/66 | HR 92 | Temp 97.9°F | Ht 67.0 in | Wt 226.4 lb

## 2015-09-03 DIAGNOSIS — E119 Type 2 diabetes mellitus without complications: Secondary | ICD-10-CM

## 2015-09-03 DIAGNOSIS — E785 Hyperlipidemia, unspecified: Secondary | ICD-10-CM

## 2015-09-03 DIAGNOSIS — I10 Essential (primary) hypertension: Secondary | ICD-10-CM

## 2015-09-03 DIAGNOSIS — E669 Obesity, unspecified: Secondary | ICD-10-CM

## 2015-09-03 DIAGNOSIS — Z1211 Encounter for screening for malignant neoplasm of colon: Secondary | ICD-10-CM

## 2015-09-03 NOTE — Progress Notes (Signed)
BP 136/66 mmHg  Pulse 92  Temp(Src) 97.9 F (36.6 C)  Ht 5\' 7"  (1.702 m)  Wt 226 lb 6.4 oz (102.694 kg)  BMI 35.45 kg/m2  SpO2 96%   Subjective:    Patient ID: [name], female    DOB: 1951/05/14, 65 y.o.   MRN: [id number]  HPI: [name] is a 65 y.o. female presenting on 09/03/2015 for Diabetes and Hyperlipidemia   HPI Chief Complaint  Patient presents with  . Diabetes    pt states she is doing pretty good  . Hyperlipidemia   States some aching in the L side.  Feels achey upon awakening but gets better after she gets up and starts moving around. Had cold in December but it got better  Relevant past medical, surgical, family and social history reviewed and updated as indicated. Interim medical history since our last visit reviewed. Allergies and medications reviewed and updated.  Current outpatient prescriptions:  .  aspirin 81 MG tablet, Take 81 mg by mouth daily., Disp: , Rfl:  .  enalapril (VASOTEC) 5 MG tablet, Take 5 mg by mouth daily., Disp: , Rfl:  .  metFORMIN (GLUCOPHAGE) 500 MG tablet, TAKE ONE TABLET BY MOUTH TWICE DAILY, Disp: 60 tablet, Rfl: 3 .  Omega-3 Fatty Acids (FISH OIL) 1000 MG CAPS, Take 2,000 mg by mouth 2 (two) times daily., Disp: , Rfl:  .  simvastatin (ZOCOR) 40 MG tablet, Take 1 tablet (40 mg total) by mouth at bedtime., Disp: 30 tablet, Rfl: 6   Review of Systems  Constitutional: Negative for fever, chills, diaphoresis, appetite change, fatigue and unexpected weight change.  HENT: Positive for sneezing. Negative for congestion, dental problem, drooling, ear pain, facial swelling, hearing loss, mouth sores, sore throat, trouble swallowing and voice change.   Eyes: Negative for pain, discharge, redness, itching and visual disturbance.  Respiratory: Negative for cough, choking, shortness of breath and wheezing.   Cardiovascular: Negative for chest pain, palpitations and leg swelling.  Gastrointestinal: Negative for vomiting,  abdominal pain, diarrhea, constipation and blood in stool.  Endocrine: Negative for cold intolerance, heat intolerance and polydipsia.  Genitourinary: Negative for dysuria, hematuria and decreased urine volume.  Musculoskeletal: Positive for back pain and arthralgias. Negative for gait problem.  Skin: Negative for rash.  Allergic/Immunologic: Positive for environmental allergies.  Neurological: Negative for seizures, syncope, light-headedness and headaches.  Hematological: Negative for adenopathy.  Psychiatric/Behavioral: Negative for suicidal ideas, dysphoric mood and agitation. The patient is not nervous/anxious.     Per HPI unless specifically indicated above     Objective:    BP 136/66 mmHg  Pulse 92  Temp(Src) 97.9 F (36.6 C)  Ht 5\' 7"  (1.702 m)  Wt 226 lb 6.4 oz (102.694 kg)  BMI 35.45 kg/m2  SpO2 96%  Wt Readings from Last 3 Encounters:  09/03/15 226 lb 6.4 oz (102.694 kg)  06/03/15 227 lb (102.967 kg)  03/12/12 225 lb (102.059 kg)    Physical Exam  Constitutional: She is oriented to person, place, and time. She appears well-developed and well-nourished.  HENT:  Head: Normocephalic and atraumatic.  Neck: Neck supple.  Cardiovascular: Normal rate and regular rhythm.   Pulmonary/Chest: Effort normal and breath sounds normal.  Abdominal: Soft. Bowel sounds are normal. She exhibits no mass. There is no hepatosplenomegaly. There is no tenderness.  Musculoskeletal: She exhibits no edema.  Lymphadenopathy:    She has no cervical adenopathy.  Neurological: She is alert and oriented to person, place, and time.  Skin: Skin is warm and dry.  Psychiatric: She has a normal mood and affect. Her behavior is normal.  Vitals reviewed.       Assessment & Plan:   Encounter Diagnoses  Name Primary?  . Type 2 diabetes mellitus without complication, unspecified long term insulin use status ([place]) Yes  . Hyperlipidemia   . Essential hypertension, benign   . Obesity   . Screen  for colon cancer     - pt to Get labs drawn -Gave iFOBT for pt to return to office -F/u 4 mo- RTO sooner prn

## 2015-09-06 DIAGNOSIS — E669 Obesity, unspecified: Secondary | ICD-10-CM | POA: Insufficient documentation

## 2015-09-09 LAB — IFOBT (OCCULT BLOOD): IMMUNOLOGICAL FECAL OCCULT BLOOD TEST: NEGATIVE

## 2015-09-10 LAB — COMPLETE METABOLIC PANEL WITH GFR
ALBUMIN: 3.8 g/dL (ref 3.6–5.1)
ALK PHOS: 46 U/L (ref 33–130)
ALT: 14 U/L (ref 6–29)
AST: 16 U/L (ref 10–35)
BILIRUBIN TOTAL: 0.4 mg/dL (ref 0.2–1.2)
BUN: 11 mg/dL (ref 7–25)
CO2: 28 mmol/L (ref 20–31)
CREATININE: 0.83 mg/dL (ref 0.50–0.99)
Calcium: 9.3 mg/dL (ref 8.6–10.4)
Chloride: 103 mmol/L (ref 98–110)
GFR, EST AFRICAN AMERICAN: 87 mL/min (ref >=60)
GFR, EST NON AFRICAN AMERICAN: 75 mL/min (ref >=60)
Glucose, Bld: 115 mg/dL — ABNORMAL HIGH (ref 65–99)
Potassium: 4.4 mmol/L (ref 3.5–5.3)
Sodium: 140 mmol/L (ref 135–146)
TOTAL PROTEIN: 7 g/dL (ref 6.1–8.1)

## 2015-09-10 LAB — LIPID PANEL
CHOLESTEROL: 121 mg/dL — AB (ref 125–200)
HDL: 29 mg/dL — ABNORMAL LOW (ref >=46)
LDL Cholesterol: 69 mg/dL (ref <130)
Total CHOL/HDL Ratio: 4.2 Ratio (ref <=5.0)
Triglycerides: 113 mg/dL (ref <150)
VLDL: 23 mg/dL (ref <30)

## 2015-09-10 LAB — HEMOGLOBIN A1C
Hgb A1c MFr Bld: 7.1 % — ABNORMAL HIGH (ref <5.7)
MEAN PLASMA GLUCOSE: 157 mg/dL — AB (ref <117)

## 2015-12-03 ENCOUNTER — Other Ambulatory Visit: Payer: Self-pay | Admitting: Physician Assistant

## 2015-12-22 ENCOUNTER — Other Ambulatory Visit: Payer: Self-pay

## 2015-12-22 DIAGNOSIS — E785 Hyperlipidemia, unspecified: Secondary | ICD-10-CM

## 2015-12-22 DIAGNOSIS — E119 Type 2 diabetes mellitus without complications: Secondary | ICD-10-CM

## 2015-12-22 DIAGNOSIS — I10 Essential (primary) hypertension: Secondary | ICD-10-CM

## 2015-12-25 LAB — HEMOGLOBIN A1C
Hgb A1c MFr Bld: 7 % — ABNORMAL HIGH (ref <5.7)
MEAN PLASMA GLUCOSE: 154 mg/dL

## 2015-12-26 LAB — COMPLETE METABOLIC PANEL WITH GFR
ALT: 17 U/L (ref 6–29)
AST: 19 U/L (ref 10–35)
Albumin: 4 g/dL (ref 3.6–5.1)
Alkaline Phosphatase: 49 U/L (ref 33–130)
BILIRUBIN TOTAL: 0.2 mg/dL (ref 0.2–1.2)
BUN: 9 mg/dL (ref 7–25)
CO2: 26 mmol/L (ref 20–31)
Calcium: 9.5 mg/dL (ref 8.6–10.4)
Chloride: 105 mmol/L (ref 98–110)
Creat: 0.9 mg/dL (ref 0.50–0.99)
GFR, EST NON AFRICAN AMERICAN: 68 mL/min (ref >=60)
GFR, Est African American: 79 mL/min (ref >=60)
GLUCOSE: 112 mg/dL — AB (ref 65–99)
POTASSIUM: 4.9 mmol/L (ref 3.5–5.3)
SODIUM: 142 mmol/L (ref 135–146)
TOTAL PROTEIN: 7 g/dL (ref 6.1–8.1)

## 2015-12-26 LAB — LIPID PANEL
Cholesterol: 118 mg/dL — ABNORMAL LOW (ref 125–200)
HDL: 29 mg/dL — AB (ref >=46)
LDL CALC: 37 mg/dL (ref <130)
Total CHOL/HDL Ratio: 4.1 Ratio (ref <=5.0)
Triglycerides: 262 mg/dL — ABNORMAL HIGH (ref <150)
VLDL: 52 mg/dL — ABNORMAL HIGH (ref <30)

## 2015-12-31 ENCOUNTER — Ambulatory Visit: Payer: Self-pay | Admitting: Physician Assistant

## 2015-12-31 ENCOUNTER — Encounter: Payer: Self-pay | Admitting: Physician Assistant

## 2015-12-31 VITALS — BP 128/60 | HR 90 | Temp 98.2°F | Ht 67.0 in | Wt 225.4 lb

## 2015-12-31 DIAGNOSIS — E669 Obesity, unspecified: Secondary | ICD-10-CM

## 2015-12-31 DIAGNOSIS — E785 Hyperlipidemia, unspecified: Secondary | ICD-10-CM

## 2015-12-31 DIAGNOSIS — I10 Essential (primary) hypertension: Secondary | ICD-10-CM

## 2015-12-31 DIAGNOSIS — E119 Type 2 diabetes mellitus without complications: Secondary | ICD-10-CM

## 2015-12-31 NOTE — Progress Notes (Signed)
BP 128/60 mmHg  Pulse 90  Temp(Src) 98.2 F (36.8 C)  Ht 5\' 7"  (1.702 m)  Wt 225 lb 6.4 oz (102.241 kg)  BMI 35.29 kg/m2  SpO2 97%   Subjective:    Patient ID: [name], female    DOB: 07/02/1951, 65 y.o.   MRN: [id number]  HPI: RHETT DEROCHER is a 65 y.o. female presenting on 12/31/2015 for Diabetes   HPI   Pt is doing well and has no complaints today  Relevant past medical, surgical, family and social history reviewed and updated as indicated. Interim medical history since our last visit reviewed. Allergies and medications reviewed and updated.  Current outpatient prescriptions:  .  aspirin 81 MG tablet, Take 81 mg by mouth daily., Disp: , Rfl:  .  enalapril (VASOTEC) 5 MG tablet, Take 5 mg by mouth daily., Disp: , Rfl:  .  metFORMIN (GLUCOPHAGE) 500 MG tablet, TAKE ONE TABLET BY MOUTH TWICE DAILY, Disp: 60 tablet, Rfl: 3 .  Omega-3 Fatty Acids (FISH OIL) 1000 MG CAPS, Take 2,000 mg by mouth 2 (two) times daily., Disp: , Rfl:  .  simvastatin (ZOCOR) 40 MG tablet, Take 1 tablet (40 mg total) by mouth at bedtime., Disp: 30 tablet, Rfl: 6   Review of Systems  Constitutional: Negative for fever, chills, diaphoresis, appetite change, fatigue and unexpected weight change.  HENT: Negative for congestion, dental problem, drooling, ear pain, facial swelling, hearing loss, mouth sores, sneezing, sore throat, trouble swallowing and voice change.   Eyes: Negative for pain, discharge, redness, itching and visual disturbance.  Respiratory: Negative for cough, choking, shortness of breath and wheezing.   Cardiovascular: Negative for chest pain, palpitations and leg swelling.  Gastrointestinal: Negative for vomiting, abdominal pain, diarrhea, constipation and blood in stool.  Endocrine: Negative for cold intolerance, heat intolerance and polydipsia.  Genitourinary: Negative for dysuria, hematuria and decreased urine volume.  Musculoskeletal: Negative for back pain,  arthralgias and gait problem.  Skin: Negative for rash.  Allergic/Immunologic: Negative for environmental allergies.  Neurological: Negative for seizures, syncope, light-headedness and headaches.  Hematological: Negative for adenopathy.  Psychiatric/Behavioral: Negative for suicidal ideas, dysphoric mood and agitation. The patient is not nervous/anxious.     Per HPI unless specifically indicated above     Objective:    BP 128/60 mmHg  Pulse 90  Temp(Src) 98.2 F (36.8 C)  Ht 5\' 7"  (1.702 m)  Wt 225 lb 6.4 oz (102.241 kg)  BMI 35.29 kg/m2  SpO2 97%  Wt Readings from Last 3 Encounters:  12/31/15 225 lb 6.4 oz (102.241 kg)  09/03/15 226 lb 6.4 oz (102.694 kg)  06/03/15 227 lb (102.967 kg)    Physical Exam  Constitutional: She is oriented to person, place, and time. She appears well-developed and well-nourished.  HENT:  Head: Normocephalic and atraumatic.  Neck: Neck supple.  Cardiovascular: Normal rate and regular rhythm.   Pulmonary/Chest: Effort normal and breath sounds normal.  Abdominal: Soft. Bowel sounds are normal. She exhibits no mass. There is no hepatosplenomegaly. There is no tenderness.  Musculoskeletal: She exhibits no edema.  Lymphadenopathy:    She has no cervical adenopathy.  Neurological: She is alert and oriented to person, place, and time.  Skin: Skin is warm and dry.  Psychiatric: She has a normal mood and affect. Her behavior is normal.  Vitals reviewed.   Results for orders placed or performed in visit on 12/22/15  Lipid Profile  Result Value Ref Range   Cholesterol 118 (L) 125 -  200 mg/dL   Triglycerides 262 (H) <150 mg/dL   HDL 29 (L) >=46 mg/dL   Total CHOL/HDL Ratio 4.1 <=5.0 Ratio   VLDL 52 (H) <30 mg/dL   LDL Cholesterol 37 <130 mg/dL  COMPLETE METABOLIC PANEL WITH GFR  Result Value Ref Range   Sodium 142 135 - 146 mmol/L   Potassium 4.9 3.5 - 5.3 mmol/L   Chloride 105 98 - 110 mmol/L   CO2 26 20 - 31 mmol/L   Glucose, Bld 112 (H) 65  - 99 mg/dL   BUN 9 7 - 25 mg/dL   Creat 0.90 0.50 - 0.99 mg/dL   Total Bilirubin 0.2 0.2 - 1.2 mg/dL   Alkaline Phosphatase 49 33 - 130 U/L   AST 19 10 - 35 U/L   ALT 17 6 - 29 U/L   Total Protein 7.0 6.1 - 8.1 g/dL   Albumin 4.0 3.6 - 5.1 g/dL   Calcium 9.5 8.6 - 10.4 mg/dL   GFR, Est African American 79 >=60 mL/min   GFR, Est Non African American 68 >=60 mL/min  HgB A1c  Result Value Ref Range   Hgb A1c MFr Bld 7.0 (H) <5.7 %   Mean Plasma Glucose 154 mg/dL      Assessment & Plan:   Encounter Diagnoses  Name Primary?  . Type 2 diabetes mellitus without complication, unspecified long term insulin use status ([place]) Yes  . Essential hypertension, benign   . Hyperlipidemia   . Obesity     -reviewed labs with pt -order annual DM eye exam  -mammogram due november -Foot exam due November -continue current medications -counseled to watch lowfat diet -f/u 4 months.  RTO sooner prn

## 2016-02-02 ENCOUNTER — Other Ambulatory Visit: Payer: Self-pay | Admitting: Physician Assistant

## 2016-03-07 ENCOUNTER — Other Ambulatory Visit: Payer: Self-pay | Admitting: Physician Assistant

## 2016-03-07 MED ORDER — SIMVASTATIN 40 MG PO TABS: 40.0000 mg | ORAL_TABLET | Freq: Every day | ORAL | 6 refills | Status: DC

## 2016-04-28 ENCOUNTER — Other Ambulatory Visit: Payer: Self-pay | Admitting: Physician Assistant

## 2016-05-02 ENCOUNTER — Ambulatory Visit: Payer: Self-pay

## 2016-05-05 ENCOUNTER — Ambulatory Visit: Payer: Self-pay | Admitting: Physician Assistant

## 2016-05-05 ENCOUNTER — Encounter: Payer: Self-pay | Admitting: Physician Assistant

## 2016-05-05 VITALS — BP 122/64 | HR 80 | Temp 97.7°F | Ht 67.0 in | Wt 222.5 lb

## 2016-05-05 DIAGNOSIS — Z1239 Encounter for other screening for malignant neoplasm of breast: Secondary | ICD-10-CM

## 2016-05-05 DIAGNOSIS — E66811 Obesity, class 1: Secondary | ICD-10-CM

## 2016-05-05 DIAGNOSIS — E119 Type 2 diabetes mellitus without complications: Secondary | ICD-10-CM

## 2016-05-05 DIAGNOSIS — E785 Hyperlipidemia, unspecified: Secondary | ICD-10-CM

## 2016-05-05 DIAGNOSIS — E669 Obesity, unspecified: Secondary | ICD-10-CM

## 2016-05-05 DIAGNOSIS — Z6834 Body mass index (BMI) 34.0-34.9, adult: Secondary | ICD-10-CM

## 2016-05-05 DIAGNOSIS — I10 Essential (primary) hypertension: Secondary | ICD-10-CM

## 2016-05-05 NOTE — Patient Instructions (Addendum)
[facility] [place] [address] Linna Hoff [place] [id number] Monday, June 06, 2016 @ 9:20 AM Dr. Jorja Loa For Diabetic Eye Exam   Back Exercises The following exercises strengthen the muscles that help to support the back. They also help to keep the lower back flexible. Doing these exercises can help to prevent back pain or lessen existing pain. If you have back pain or discomfort, try doing these exercises 2-3 times each day or as told by your health care provider. When the pain goes away, do them once each day, but increase the number of times that you repeat the steps for each exercise (do more repetitions). If you do not have back pain or discomfort, do these exercises once each day or as told by your health care provider. EXERCISES Single Knee to Chest Repeat these steps 3-5 times for each leg: 1. Lie on your back on a firm bed or the floor with your legs extended. 2. Bring one knee to your chest. Your other leg should stay extended and in contact with the floor. 3. Hold your knee in place by grabbing your knee or thigh. 4. Pull on your knee until you feel a gentle stretch in your lower back. 5. Hold the stretch for 10-30 seconds. 6. Slowly release and straighten your leg. Pelvic Tilt Repeat these steps 5-10 times: 1. Lie on your back on a firm bed or the floor with your legs extended. 2. Bend your knees so they are pointing toward the ceiling and your feet are flat on the floor. 3. Tighten your lower abdominal muscles to press your lower back against the floor. This motion will tilt your pelvis so your tailbone points up toward the ceiling instead of pointing to your feet or the floor. 4. With gentle tension and even breathing, hold this position for 5-10 seconds. Cat-Cow Repeat these steps until your lower back becomes more flexible: 1. Get into a hands-and-knees position on a firm surface. Keep your hands under your shoulders, and keep your knees under your hips.  You may place padding under your knees for comfort. 2. Let your head hang down, and point your tailbone toward the floor so your lower back becomes rounded like the back of a cat. 3. Hold this position for 5 seconds. 4. Slowly lift your head and point your tailbone up toward the ceiling so your back forms a sagging arch like the back of a cow. 5. Hold this position for 5 seconds. Press-Ups Repeat these steps 5-10 times: 1. Lie on your abdomen (face-down) on the floor. 2. Place your palms near your head, about shoulder-width apart. 3. While you keep your back as relaxed as possible and keep your hips on the floor, slowly straighten your arms to raise the top half of your body and lift your shoulders. Do not use your back muscles to raise your upper torso. You may adjust the placement of your hands to make yourself more comfortable. 4. Hold this position for 5 seconds while you keep your back relaxed. 5. Slowly return to lying flat on the floor. Bridges Repeat these steps 10 times: 1. Lie on your back on a firm surface. 2. Bend your knees so they are pointing toward the ceiling and your feet are flat on the floor. 3. Tighten your buttocks muscles and lift your buttocks off of the floor until your waist is at almost the same height as your knees. You should feel the muscles working in your buttocks and the back of  your thighs. If you do not feel these muscles, slide your feet 1-2 inches farther away from your buttocks. 4. Hold this position for 3-5 seconds. 5. Slowly lower your hips to the starting position, and allow your buttocks muscles to relax completely. If this exercise is too easy, try doing it with your arms crossed over your chest. Abdominal Crunches Repeat these steps 5-10 times: 1. Lie on your back on a firm bed or the floor with your legs extended. 2. Bend your knees so they are pointing toward the ceiling and your feet are flat on the floor. 3. Cross your arms over your  chest. 4. Tip your chin slightly toward your chest without bending your neck. 5. Tighten your abdominal muscles and slowly raise your trunk (torso) high enough to lift your shoulder blades a tiny bit off of the floor. Avoid raising your torso higher than that, because it can put too much stress on your low back and it does not help to strengthen your abdominal muscles. 6. Slowly return to your starting position. Back Lifts Repeat these steps 5-10 times: 1. Lie on your abdomen (face-down) with your arms at your sides, and rest your forehead on the floor. 2. Tighten the muscles in your legs and your buttocks. 3. Slowly lift your chest off of the floor while you keep your hips pressed to the floor. Keep the back of your head in line with the curve in your back. Your eyes should be looking at the floor. 4. Hold this position for 3-5 seconds. 5. Slowly return to your starting position. SEEK MEDICAL CARE IF:  Your back pain or discomfort gets much worse when you do an exercise.  Your back pain or discomfort does not lessen within 2 hours after you exercise. If you have any of these problems, stop doing these exercises right away. Do not do them again unless your health care provider says that you can. SEEK IMMEDIATE MEDICAL CARE IF:  You develop sudden, severe back pain. If this happens, stop doing the exercises right away. Do not do them again unless your health care provider says that you can.   This information is not intended to replace advice given to you by your health care provider. Make sure you discuss any questions you have with your health care provider.   Document Released: 08/25/2004 Document Revised: 04/08/2015 Document Reviewed: 09/11/2014 Elsevier Interactive Patient Education [employer].

## 2016-05-05 NOTE — Progress Notes (Signed)
BP 122/64 (BP Location: Left Arm, Patient Position: Sitting, Cuff Size: Normal)   Pulse 80   Temp 97.7 F (36.5 C)   Ht 5\' 7"  (1.702 m)   Wt 222 lb 8 oz (100.9 kg)   SpO2 97%   BMI 34.85 kg/m    Subjective:    Patient ID: [name], female    DOB: 1951-05-21, 65 y.o.   MRN: [id number]  HPI: [name] is a 65 y.o. female presenting on 05/05/2016 for Diabetes   HPI   Pt is doing well. No complaints today  Relevant past medical, surgical, family and social history reviewed and updated as indicated. Interim medical history since our last visit reviewed. Allergies and medications reviewed and updated.   Current Outpatient Prescriptions:  .  aspirin 81 MG tablet, Take 81 mg by mouth daily., Disp: , Rfl:  .  enalapril (VASOTEC) 5 MG tablet, TAKE ONE TABLET BY MOUTH ONCE DAILY, Disp: 90 tablet, Rfl: 2 .  metFORMIN (GLUCOPHAGE) 500 MG tablet, TAKE ONE TABLET BY MOUTH TWICE DAILY, Disp: 60 tablet, Rfl: 3 .  Omega-3 Fatty Acids (FISH OIL) 1000 MG CAPS, Take 2,000 mg by mouth 2 (two) times daily., Disp: , Rfl:  .  simvastatin (ZOCOR) 40 MG tablet, Take 1 tablet (40 mg total) by mouth at bedtime., Disp: 30 tablet, Rfl: 6   Review of Systems  Constitutional: Negative for appetite change, chills, diaphoresis, fatigue, fever and unexpected weight change.  HENT: Negative for congestion, dental problem, drooling, ear pain, facial swelling, hearing loss, mouth sores, sneezing, sore throat, trouble swallowing and voice change.   Eyes: Negative for pain, discharge, redness, itching and visual disturbance.  Respiratory: Negative for cough, choking, shortness of breath and wheezing.   Cardiovascular: Negative for chest pain, palpitations and leg swelling.  Gastrointestinal: Negative for abdominal pain, blood in stool, constipation, diarrhea and vomiting.  Endocrine: Negative for cold intolerance, heat intolerance and polydipsia.  Genitourinary: Negative for decreased urine  volume, dysuria and hematuria.  Musculoskeletal: Positive for arthralgias. Negative for back pain and gait problem.  Skin: Negative for rash.  Allergic/Immunologic: Positive for environmental allergies.  Neurological: Negative for seizures, syncope, light-headedness and headaches.  Hematological: Negative for adenopathy.  Psychiatric/Behavioral: Negative for agitation, dysphoric mood and suicidal ideas. The patient is not nervous/anxious.     Per HPI unless specifically indicated above     Objective:    BP 122/64 (BP Location: Left Arm, Patient Position: Sitting, Cuff Size: Normal)   Pulse 80   Temp 97.7 F (36.5 C)   Ht 5\' 7"  (1.702 m)   Wt 222 lb 8 oz (100.9 kg)   SpO2 97%   BMI 34.85 kg/m   Wt Readings from Last 3 Encounters:  05/05/16 222 lb 8 oz (100.9 kg)  12/31/15 225 lb 6.4 oz (102.2 kg)  09/03/15 226 lb 6.4 oz (102.7 kg)    Physical Exam  Constitutional: She is oriented to person, place, and time. She appears well-developed and well-nourished.  HENT:  Head: Normocephalic and atraumatic.  Neck: Neck supple.  Cardiovascular: Normal rate and regular rhythm.   Pulmonary/Chest: Effort normal and breath sounds normal.  Abdominal: Soft. Bowel sounds are normal. She exhibits no mass. There is no hepatosplenomegaly. There is no tenderness.  Musculoskeletal: She exhibits no edema.  Lymphadenopathy:    She has no cervical adenopathy.  Neurological: She is alert and oriented to person, place, and time.  Skin: Skin is warm and dry.  Psychiatric: She has a  normal mood and affect. Her behavior is normal.  Vitals reviewed.   Foot exam done    Assessment & Plan:   Encounter Diagnoses  Name Primary?  . Type 2 diabetes mellitus without complication, unspecified long term insulin use status ([place]) Yes  . Hyperlipidemia   . Essential hypertension, benign   . Class 1 obesity with body mass index (BMI) of 34.0 to 34.9 in adult, unspecified obesity type, unspecified whether  serious comorbidity present   . Screening for breast cancer     -pt to Go to lab to get fasting tests done this week. Will call with results -schedule screening mammogram -continue current medications -diabetic eye exam as scheduled -F/u 4 months.  RTO sooner prn

## 2016-05-18 LAB — COMPLETE METABOLIC PANEL WITH GFR
ALBUMIN: 4 g/dL (ref 3.6–5.1)
ALK PHOS: 55 U/L (ref 33–130)
ALT: 15 U/L (ref 6–29)
AST: 18 U/L (ref 10–35)
BILIRUBIN TOTAL: 0.5 mg/dL (ref 0.2–1.2)
BUN: 11 mg/dL (ref 7–25)
CO2: 29 mmol/L (ref 20–31)
CREATININE: 0.81 mg/dL (ref 0.50–0.99)
Calcium: 9.8 mg/dL (ref 8.6–10.4)
Chloride: 102 mmol/L (ref 98–110)
GFR, Est African American: 89 mL/min (ref >=60)
GFR, Est Non African American: 78 mL/min (ref >=60)
GLUCOSE: 85 mg/dL (ref 65–99)
Potassium: 4.7 mmol/L (ref 3.5–5.3)
SODIUM: 140 mmol/L (ref 135–146)
TOTAL PROTEIN: 7.6 g/dL (ref 6.1–8.1)

## 2016-05-18 LAB — LIPID PANEL
Cholesterol: 171 mg/dL (ref 125–200)
HDL: 33 mg/dL — ABNORMAL LOW (ref >=46)
LDL Cholesterol: 103 mg/dL (ref <130)
Total CHOL/HDL Ratio: 5.2 Ratio — ABNORMAL HIGH (ref <=5.0)
Triglycerides: 174 mg/dL — ABNORMAL HIGH (ref <150)
VLDL: 35 mg/dL — ABNORMAL HIGH (ref <30)

## 2016-05-18 LAB — MICROALBUMIN, URINE: Microalb, Ur: 1.4 mg/dL

## 2016-05-18 LAB — HEMOGLOBIN A1C
HEMOGLOBIN A1C: 6.7 % — AB (ref <5.7)
MEAN PLASMA GLUCOSE: 146 mg/dL

## 2016-05-25 ENCOUNTER — Ambulatory Visit (HOSPITAL_COMMUNITY)
Admission: RE | Admit: 2016-05-25 | Discharge: 2016-05-25 | Disposition: A | Discharge status: Home / Self Care | Payer: Self-pay | Source: Ambulatory Visit | Attending: Physician Assistant | Admitting: Physician Assistant

## 2016-05-25 DIAGNOSIS — Z1231 Encounter for screening mammogram for malignant neoplasm of breast: Secondary | ICD-10-CM | POA: Insufficient documentation

## 2016-09-06 ENCOUNTER — Ambulatory Visit: Payer: Self-pay | Admitting: Physician Assistant

## 2016-09-13 ENCOUNTER — Other Ambulatory Visit: Payer: Self-pay | Admitting: Physician Assistant

## 2016-09-13 ENCOUNTER — Encounter: Payer: Self-pay | Admitting: Physician Assistant

## 2016-09-13 ENCOUNTER — Ambulatory Visit: Payer: Self-pay | Admitting: Physician Assistant

## 2016-09-13 VITALS — BP 130/76 | HR 80 | Temp 97.5°F | Ht 67.0 in | Wt 219.8 lb

## 2016-09-13 DIAGNOSIS — E669 Obesity, unspecified: Secondary | ICD-10-CM

## 2016-09-13 DIAGNOSIS — E785 Hyperlipidemia, unspecified: Secondary | ICD-10-CM

## 2016-09-13 DIAGNOSIS — Z1211 Encounter for screening for malignant neoplasm of colon: Secondary | ICD-10-CM

## 2016-09-13 DIAGNOSIS — I10 Essential (primary) hypertension: Secondary | ICD-10-CM

## 2016-09-13 DIAGNOSIS — Z6834 Body mass index (BMI) 34.0-34.9, adult: Secondary | ICD-10-CM

## 2016-09-13 DIAGNOSIS — E119 Type 2 diabetes mellitus without complications: Secondary | ICD-10-CM

## 2016-09-13 NOTE — Progress Notes (Signed)
BP 130/76 (BP Location: Left Arm, Patient Position: Sitting, Cuff Size: Normal)   Pulse 80   Temp 97.5 F (36.4 C)   Ht 5\' 7"  (1.702 m)   Wt 219 lb 12 oz (99.7 kg)   SpO2 98%   BMI 34.42 kg/m    Subjective:    Patient ID: [name], female    DOB: Jun 05, 1951, 66 y.o.   MRN: [id number]  HPI: [name] is a 66 y.o. female presenting on 09/13/2016 for Diabetes; Hyperlipidemia; and Hypertension   HPI   Pt well today  Relevant past medical, surgical, family and social history reviewed and updated as indicated. Interim medical history since our last visit reviewed. Allergies and medications reviewed and updated.   Current Outpatient Prescriptions:  .  aspirin 81 MG tablet, Take 81 mg by mouth daily., Disp: , Rfl:  .  enalapril (VASOTEC) 5 MG tablet, TAKE ONE TABLET BY MOUTH ONCE DAILY, Disp: 90 tablet, Rfl: 2 .  metFORMIN (GLUCOPHAGE) 500 MG tablet, TAKE ONE TABLET BY MOUTH TWICE DAILY, Disp: 60 tablet, Rfl: 3 .  Omega-3 Fatty Acids (FISH OIL) 1000 MG CAPS, Take 2,000 mg by mouth 2 (two) times daily., Disp: , Rfl:  .  simvastatin (ZOCOR) 40 MG tablet, Take 1 tablet (40 mg total) by mouth at bedtime., Disp: 30 tablet, Rfl: 6   Review of Systems  Constitutional: Negative for appetite change, chills, diaphoresis, fatigue, fever and unexpected weight change.  HENT: Negative for congestion, dental problem, drooling, ear pain, facial swelling, hearing loss, mouth sores, sneezing, sore throat, trouble swallowing and voice change.   Eyes: Negative for pain, discharge, redness, itching and visual disturbance.  Respiratory: Negative for cough, choking, shortness of breath and wheezing.   Cardiovascular: Negative for chest pain, palpitations and leg swelling.  Gastrointestinal: Negative for abdominal pain, blood in stool, constipation, diarrhea and vomiting.  Endocrine: Negative for cold intolerance, heat intolerance and polydipsia.  Genitourinary: Negative for decreased  urine volume, dysuria and hematuria.  Musculoskeletal: Negative for arthralgias, back pain and gait problem.  Skin: Negative for rash.  Allergic/Immunologic: Negative for environmental allergies.  Neurological: Negative for seizures, syncope, light-headedness and headaches.  Hematological: Negative for adenopathy.  Psychiatric/Behavioral: Negative for agitation, dysphoric mood and suicidal ideas. The patient is not nervous/anxious.     Per HPI unless specifically indicated above     Objective:    BP 130/76 (BP Location: Left Arm, Patient Position: Sitting, Cuff Size: Normal)   Pulse 80   Temp 97.5 F (36.4 C)   Ht 5\' 7"  (1.702 m)   Wt 219 lb 12 oz (99.7 kg)   SpO2 98%   BMI 34.42 kg/m   Wt Readings from Last 3 Encounters:  09/13/16 219 lb 12 oz (99.7 kg)  05/05/16 222 lb 8 oz (100.9 kg)  12/31/15 225 lb 6.4 oz (102.2 kg)    Physical Exam  Constitutional: She is oriented to person, place, and time. She appears well-developed and well-nourished.  HENT:  Head: Normocephalic and atraumatic.  Neck: Neck supple.  Cardiovascular: Normal rate and regular rhythm.   Pulmonary/Chest: Effort normal and breath sounds normal.  Abdominal: Soft. Bowel sounds are normal. She exhibits no mass. There is no hepatosplenomegaly. There is no tenderness.  Musculoskeletal: She exhibits no edema.  Lymphadenopathy:    She has no cervical adenopathy.  Neurological: She is alert and oriented to person, place, and time.  Skin: Skin is warm and dry.  Psychiatric: She has a normal mood and  affect. Her behavior is normal.  Vitals reviewed.   Results for orders placed or performed in visit on 05/05/16  HgB A1c  Result Value Ref Range   Hgb A1c MFr Bld 6.7 (H) <5.7 %   Mean Plasma Glucose 146 mg/dL  Lipid Profile  Result Value Ref Range   Cholesterol 171 125 - 200 mg/dL   Triglycerides 174 (H) <150 mg/dL   HDL 33 (L) >=46 mg/dL   Total CHOL/HDL Ratio 5.2 (H) <=5.0 Ratio   VLDL 35 (H) <30 mg/dL    LDL Cholesterol 103 <130 mg/dL  COMPLETE METABOLIC PANEL WITH GFR  Result Value Ref Range   Sodium 140 135 - 146 mmol/L   Potassium 4.7 3.5 - 5.3 mmol/L   Chloride 102 98 - 110 mmol/L   CO2 29 20 - 31 mmol/L   Glucose, Bld 85 65 - 99 mg/dL   BUN 11 7 - 25 mg/dL   Creat 0.81 0.50 - 0.99 mg/dL   Total Bilirubin 0.5 0.2 - 1.2 mg/dL   Alkaline Phosphatase 55 33 - 130 U/L   AST 18 10 - 35 U/L   ALT 15 6 - 29 U/L   Total Protein 7.6 6.1 - 8.1 g/dL   Albumin 4.0 3.6 - 5.1 g/dL   Calcium 9.8 8.6 - 10.4 mg/dL   GFR, Est African American 89 >=60 mL/min   GFR, Est Non African American 78 >=60 mL/min  Microalbumin, urine  Result Value Ref Range   Microalb, Ur 1.4 Not estab mg/dL      Assessment & Plan:    Encounter Diagnoses  Name Primary?  . Type 2 diabetes mellitus without complication, unspecified long term insulin use status ([place]) Yes  . Hyperlipidemia, unspecified hyperlipidemia type   . Essential hypertension, benign   . Class 1 obesity with body mass index (BMI) of 34.0 to 34.9 in adult, unspecified obesity type, unspecified whether serious comorbidity present   . Screen for colon cancer     -pt given iFOBT for colon cancer screening -pt to continue current medications -check labs -F/u 4 months.  RTO sooner prn

## 2016-09-14 LAB — COMPREHENSIVE METABOLIC PANEL
ALBUMIN: 4.2 g/dL (ref 3.6–5.1)
ALT: 18 U/L (ref 6–29)
AST: 20 U/L (ref 10–35)
Alkaline Phosphatase: 48 U/L (ref 33–130)
BILIRUBIN TOTAL: 0.7 mg/dL (ref 0.2–1.2)
BUN: 10 mg/dL (ref 7–25)
CALCIUM: 9.5 mg/dL (ref 8.6–10.4)
CHLORIDE: 106 mmol/L (ref 98–110)
CO2: 30 mmol/L (ref 20–31)
Creat: 0.82 mg/dL (ref 0.50–0.99)
Glucose, Bld: 84 mg/dL (ref 65–99)
Potassium: 4.2 mmol/L (ref 3.5–5.3)
Sodium: 144 mmol/L (ref 135–146)
TOTAL PROTEIN: 7.3 g/dL (ref 6.1–8.1)

## 2016-09-14 LAB — LIPID PANEL
CHOLESTEROL: 123 mg/dL (ref <200)
HDL: 34 mg/dL — AB (ref >50)
LDL CALC: 66 mg/dL (ref <100)
TRIGLYCERIDES: 117 mg/dL (ref <150)
Total CHOL/HDL Ratio: 3.6 Ratio (ref <5.0)
VLDL: 23 mg/dL (ref <30)

## 2016-09-14 LAB — HEMOGLOBIN A1C
Hgb A1c MFr Bld: 6.6 % — ABNORMAL HIGH (ref <5.7)
Mean Plasma Glucose: 143 mg/dL

## 2016-09-15 LAB — IFOBT (OCCULT BLOOD): IFOBT: NEGATIVE

## 2016-10-11 ENCOUNTER — Encounter: Payer: Self-pay | Admitting: Physician Assistant

## 2016-11-22 ENCOUNTER — Other Ambulatory Visit: Payer: Self-pay | Admitting: Physician Assistant

## 2016-12-28 ENCOUNTER — Other Ambulatory Visit: Payer: Self-pay | Admitting: Physician Assistant

## 2016-12-28 MED ORDER — SIMVASTATIN 40 MG PO TABS: 40.0000 mg | ORAL_TABLET | Freq: Every day | ORAL | 6 refills | Status: DC

## 2017-01-02 ENCOUNTER — Other Ambulatory Visit: Payer: Self-pay | Admitting: Physician Assistant

## 2017-01-11 ENCOUNTER — Ambulatory Visit: Payer: Self-pay | Admitting: Physician Assistant

## 2017-01-11 ENCOUNTER — Encounter: Payer: Self-pay | Admitting: Physician Assistant

## 2017-01-11 VITALS — BP 128/76 | HR 86 | Temp 97.9°F | Ht 67.0 in | Wt 216.5 lb

## 2017-01-11 DIAGNOSIS — I10 Essential (primary) hypertension: Secondary | ICD-10-CM

## 2017-01-11 DIAGNOSIS — E785 Hyperlipidemia, unspecified: Secondary | ICD-10-CM

## 2017-01-11 DIAGNOSIS — E669 Obesity, unspecified: Secondary | ICD-10-CM

## 2017-01-11 DIAGNOSIS — E119 Type 2 diabetes mellitus without complications: Secondary | ICD-10-CM

## 2017-01-11 NOTE — Progress Notes (Signed)
BP 128/76 (BP Location: Left Arm, Patient Position: Sitting, Cuff Size: Normal)   Pulse 86   Temp 97.9 F (36.6 C)   Ht 5\' 7"  (1.702 m)   Wt 216 lb 8 oz (98.2 kg)   SpO2 99%   BMI 33.91 kg/m    Subjective:    Patient ID: [name], female    DOB: [birth year], 66 y.o.   MRN: [id number]  HPI: [name] is a 66 y.o. female presenting on 01/11/2017 for Diabetes; Hyperlipidemia; and Hypertension   HPI   Pt is doing well and has no complaints today.  She says she is active.  Relevant past medical, surgical, family and social history reviewed and updated as indicated. Interim medical history since our last visit reviewed. Allergies and medications reviewed and updated.   Current Outpatient Prescriptions:  .  aspirin 81 MG tablet, Take 81 mg by mouth daily., Disp: , Rfl:  .  enalapril (VASOTEC) 5 MG tablet, TAKE ONE TABLET BY MOUTH ONCE DAILY, Disp: 90 tablet, Rfl: 1 .  metFORMIN (GLUCOPHAGE) 500 MG tablet, TAKE ONE TABLET BY MOUTH TWICE DAILY, Disp: 60 tablet, Rfl: 3 .  Multiple Vitamin (MULTIVITAMINS PO), Take 1 tablet by mouth daily., Disp: , Rfl:  .  Omega-3 Fatty Acids (FISH OIL) 1000 MG CAPS, Take 2,000 mg by mouth 2 (two) times daily., Disp: , Rfl:  .  simvastatin (ZOCOR) 40 MG tablet, Take 1 tablet (40 mg total) by mouth at bedtime., Disp: 30 tablet, Rfl: 6   Review of Systems  Constitutional: Negative for appetite change, chills, diaphoresis, fatigue, fever and unexpected weight change.  HENT: Negative for congestion, dental problem, drooling, ear pain, facial swelling, hearing loss, mouth sores, sneezing, sore throat, trouble swallowing and voice change.   Eyes: Negative for pain, discharge, redness, itching and visual disturbance.  Respiratory: Negative for cough, choking, shortness of breath and wheezing.   Cardiovascular: Negative for chest pain, palpitations and leg swelling.  Gastrointestinal: Negative for abdominal pain, blood in stool,  constipation, diarrhea and vomiting.  Endocrine: Negative for cold intolerance, heat intolerance and polydipsia.  Genitourinary: Negative for decreased urine volume, dysuria and hematuria.  Musculoskeletal: Positive for arthralgias and back pain. Negative for gait problem.  Skin: Negative for rash.  Allergic/Immunologic: Positive for environmental allergies.  Neurological: Positive for headaches. Negative for seizures, syncope and light-headedness.  Hematological: Negative for adenopathy.  Psychiatric/Behavioral: Negative for agitation, dysphoric mood and suicidal ideas. The patient is not nervous/anxious.     Per HPI unless specifically indicated above     Objective:    BP 128/76 (BP Location: Left Arm, Patient Position: Sitting, Cuff Size: Normal)   Pulse 86   Temp 97.9 F (36.6 C)   Ht 5\' 7"  (1.702 m)   Wt 216 lb 8 oz (98.2 kg)   SpO2 99%   BMI 33.91 kg/m   Wt Readings from Last 3 Encounters:  01/11/17 216 lb 8 oz (98.2 kg)  09/13/16 219 lb 12 oz (99.7 kg)  05/05/16 222 lb 8 oz (100.9 kg)    Physical Exam  Constitutional: She is oriented to person, place, and time. She appears well-developed and well-nourished.  HENT:  Head: Normocephalic and atraumatic.  Neck: Neck supple.  Cardiovascular: Normal rate and regular rhythm.   Pulmonary/Chest: Effort normal and breath sounds normal.  Abdominal: Soft. Bowel sounds are normal. She exhibits no mass. There is no hepatosplenomegaly. There is no tenderness.  Musculoskeletal: She exhibits no edema.  Lymphadenopathy:  She has no cervical adenopathy.  Neurological: She is alert and oriented to person, place, and time.  Skin: Skin is warm and dry.  Psychiatric: She has a normal mood and affect. Her behavior is normal.  Vitals reviewed.       Assessment & Plan:   Encounter Diagnoses  Name Primary?  . Type 2 diabetes mellitus without complication, without long-term current use of insulin ([place]) Yes  . Essential  hypertension, benign   . Hyperlipidemia, unspecified hyperlipidemia type   . Obesity, unspecified classification, unspecified obesity type, unspecified whether serious comorbidity present      -pt to Get fasting labs tomorrow morning.  Will call with results -pt to continue current medications. -will follow up in 4 months.  Discussed with pt that will be her last regular appointment at [facility] as she will be getting medicare in 5 months.  Pt to RTO sooner prn

## 2017-01-16 ENCOUNTER — Other Ambulatory Visit (HOSPITAL_COMMUNITY)
Admission: RE | Admit: 2017-01-16 | Discharge: 2017-01-16 | Disposition: A | Discharge status: Home / Self Care | Payer: Self-pay | Source: Ambulatory Visit | Attending: Physician Assistant | Admitting: Physician Assistant

## 2017-01-16 DIAGNOSIS — E119 Type 2 diabetes mellitus without complications: Secondary | ICD-10-CM | POA: Insufficient documentation

## 2017-01-16 DIAGNOSIS — I10 Essential (primary) hypertension: Secondary | ICD-10-CM | POA: Insufficient documentation

## 2017-01-16 DIAGNOSIS — E785 Hyperlipidemia, unspecified: Secondary | ICD-10-CM | POA: Insufficient documentation

## 2017-01-16 LAB — COMPREHENSIVE METABOLIC PANEL
ALBUMIN: 3.9 g/dL (ref 3.5–5.0)
ALT: 21 U/L (ref 14–54)
AST: 21 U/L (ref 15–41)
Alkaline Phosphatase: 46 U/L (ref 38–126)
Anion gap: 7 (ref 5–15)
BUN: 10 mg/dL (ref 6–20)
CHLORIDE: 107 mmol/L (ref 101–111)
CO2: 29 mmol/L (ref 22–32)
CREATININE: 0.78 mg/dL (ref 0.44–1.00)
Calcium: 9.3 mg/dL (ref 8.9–10.3)
GFR calc Af Amer: >60 mL/min (ref >60)
Glucose, Bld: 100 mg/dL — ABNORMAL HIGH (ref 65–99)
POTASSIUM: 4.2 mmol/L (ref 3.5–5.1)
Sodium: 143 mmol/L (ref 135–145)
Total Bilirubin: 0.5 mg/dL (ref 0.3–1.2)
Total Protein: 7.7 g/dL (ref 6.5–8.1)

## 2017-01-16 LAB — LIPID PANEL
CHOL/HDL RATIO: 4.6 ratio
Cholesterol: 129 mg/dL (ref 0–200)
HDL: 28 mg/dL — AB (ref >40)
LDL Cholesterol: 83 mg/dL (ref 0–99)
Triglycerides: 90 mg/dL (ref <150)
VLDL: 18 mg/dL (ref 0–40)

## 2017-01-17 LAB — HEMOGLOBIN A1C
HEMOGLOBIN A1C: 6.5 % — AB (ref 4.8–5.6)
MEAN PLASMA GLUCOSE: 140 mg/dL

## 2017-01-19 ENCOUNTER — Encounter: Payer: Self-pay | Admitting: Physician Assistant

## 2017-01-19 ENCOUNTER — Encounter: Payer: Self-pay | Admitting: Student

## 2017-01-19 NOTE — Progress Notes (Signed)
LPN called and notified pt on 01-19-2017 that labs done 01-16-17 are good. Pt verbalized understanding.

## 2017-01-19 NOTE — Telephone Encounter (Signed)
Erroneous encounter

## 2017-05-01 ENCOUNTER — Ambulatory Visit: Payer: Self-pay | Admitting: Physician Assistant

## 2017-05-10 ENCOUNTER — Ambulatory Visit: Payer: Self-pay | Admitting: Physician Assistant

## 2017-05-11 ENCOUNTER — Ambulatory Visit: Payer: Self-pay | Admitting: Physician Assistant

## 2017-05-11 ENCOUNTER — Encounter: Payer: Self-pay | Admitting: Physician Assistant

## 2017-05-11 VITALS — BP 116/60 | HR 92 | Temp 97.2°F | Wt 219.5 lb

## 2017-05-11 DIAGNOSIS — I10 Essential (primary) hypertension: Secondary | ICD-10-CM

## 2017-05-11 DIAGNOSIS — E669 Obesity, unspecified: Secondary | ICD-10-CM

## 2017-05-11 DIAGNOSIS — E119 Type 2 diabetes mellitus without complications: Secondary | ICD-10-CM

## 2017-05-11 DIAGNOSIS — E785 Hyperlipidemia, unspecified: Secondary | ICD-10-CM

## 2017-05-11 MED ORDER — ENALAPRIL MALEATE 5 MG PO TABS: 5.0000 mg | ORAL_TABLET | Freq: Every day | ORAL | 1 refills | Status: DC

## 2017-05-11 NOTE — Patient Instructions (Addendum)
Drs Donnetta Hail, [place]  [phone]

## 2017-05-11 NOTE — Progress Notes (Signed)
BP 116/60 (BP Location: Left Arm, Patient Position: Sitting, Cuff Size: Normal)   Pulse 92   Temp (!) 97.2 F (36.2 C)   Wt 219 lb 8 oz (99.6 kg)   SpO2 93%   BMI 34.38 kg/m    Subjective:    Patient ID: [name], female    DOB: 10/18/1950, 66 y.o.   MRN: [id number]  HPI: [name] is a 66 y.o. female presenting on 05/11/2017 for No chief complaint on file.   HPI   Pt is doing well today and has no complaints.  Pt is turning 66 next month so her medicare starts 06/01/17.  Pt is aware she will need to establish with new PCP since she is getting insurance.   Gave pt phone numbers for [facility].   Relevant past medical, surgical, family and social history reviewed and updated as indicated. Interim medical history since our last visit reviewed. Allergies and medications reviewed and updated.   Current Outpatient Prescriptions:  .  aspirin 81 MG tablet, Take 81 mg by mouth daily., Disp: , Rfl:  .  enalapril (VASOTEC) 5 MG tablet, TAKE ONE TABLET BY MOUTH ONCE DAILY, Disp: 90 tablet, Rfl: 1 .  metFORMIN (GLUCOPHAGE) 500 MG tablet, TAKE ONE TABLET BY MOUTH TWICE DAILY, Disp: 60 tablet, Rfl: 3 .  Multiple Vitamin (MULTIVITAMINS PO), Take 1 tablet by mouth daily., Disp: , Rfl:  .  Omega-3 Fatty Acids (FISH OIL) 1000 MG CAPS, Take 2,000 mg by mouth 2 (two) times daily., Disp: , Rfl:  .  simvastatin (ZOCOR) 40 MG tablet, Take 1 tablet (40 mg total) by mouth at bedtime., Disp: 30 tablet, Rfl: 6   Review of Systems  Constitutional: Negative for appetite change, chills, diaphoresis, fatigue, fever and unexpected weight change.  HENT: Negative for congestion, dental problem, drooling, ear pain, facial swelling, hearing loss, mouth sores, sneezing, sore throat, trouble swallowing and voice change.   Eyes: Positive for visual disturbance. Negative for pain, discharge, redness and itching.  Respiratory: Negative for cough, choking, shortness of breath and  wheezing.   Cardiovascular: Negative for chest pain, palpitations and leg swelling.  Gastrointestinal: Negative for abdominal pain, blood in stool, constipation, diarrhea and vomiting.  Endocrine: Negative for cold intolerance, heat intolerance and polydipsia.  Genitourinary: Negative for decreased urine volume, dysuria and hematuria.  Musculoskeletal: Positive for arthralgias, back pain and gait problem.  Skin: Negative for rash.  Allergic/Immunologic: Positive for environmental allergies.  Neurological: Negative for seizures, syncope, light-headedness and headaches.  Hematological: Negative for adenopathy.  Psychiatric/Behavioral: Negative for agitation, dysphoric mood and suicidal ideas. The patient is not nervous/anxious.     Per HPI unless specifically indicated above     Objective:    BP 116/60 (BP Location: Left Arm, Patient Position: Sitting, Cuff Size: Normal)   Pulse 92   Temp (!) 97.2 F (36.2 C)   Wt 219 lb 8 oz (99.6 kg)   SpO2 93%   BMI 34.38 kg/m   Wt Readings from Last 3 Encounters:  05/11/17 219 lb 8 oz (99.6 kg)  01/11/17 216 lb 8 oz (98.2 kg)  09/13/16 219 lb 12 oz (99.7 kg)    Physical Exam  Constitutional: She is oriented to person, place, and time. She appears well-developed and well-nourished.  HENT:  Head: Normocephalic and atraumatic.  Neck: Neck supple.  Cardiovascular: Normal rate and regular rhythm.   Pulmonary/Chest: Effort normal and breath sounds normal.  Abdominal: Soft. Bowel sounds are normal. She exhibits  no mass. There is no hepatosplenomegaly. There is no tenderness.  Musculoskeletal: She exhibits no edema.  Lymphadenopathy:    She has no cervical adenopathy.  Neurological: She is alert and oriented to person, place, and time.  Skin: Skin is warm and dry.  Psychiatric: She has a normal mood and affect. Her behavior is normal.  Vitals reviewed.       Assessment & Plan:    Encounter Diagnoses  Name Primary?  . Type 2 diabetes  mellitus without complication, without long-term current use of insulin ([place]) Yes  . Essential hypertension, benign   . Hyperlipidemia, unspecified hyperlipidemia type   . Obesity, unspecified classification, unspecified obesity type, unspecified whether serious comorbidity present     -Pt's diabetic foot exam, diabetic eye exam, mammogram and labs are all due.   Will defer to new PCP.  Discussed with pt who agrees- she has been well-controlled and stable for years -pt to get established with new PCP as discussed above -pt to return here for any problems prior to 06/01/17

## 2017-08-03 ENCOUNTER — Ambulatory Visit (INDEPENDENT_AMBULATORY_CARE_PROVIDER_SITE_OTHER): Payer: Medicare Other | Admitting: Family Medicine

## 2017-08-03 ENCOUNTER — Encounter: Payer: Self-pay | Admitting: Family Medicine

## 2017-08-03 ENCOUNTER — Other Ambulatory Visit: Payer: Self-pay

## 2017-08-03 VITALS — BP 136/86 | HR 72 | Temp 97.7°F | Resp 16 | Ht 67.0 in | Wt 224.1 lb

## 2017-08-03 DIAGNOSIS — K5909 Other constipation: Secondary | ICD-10-CM

## 2017-08-03 DIAGNOSIS — Z23 Encounter for immunization: Secondary | ICD-10-CM

## 2017-08-03 DIAGNOSIS — Z114 Encounter for screening for human immunodeficiency virus [HIV]: Secondary | ICD-10-CM | POA: Diagnosis not present

## 2017-08-03 DIAGNOSIS — E785 Hyperlipidemia, unspecified: Secondary | ICD-10-CM | POA: Diagnosis not present

## 2017-08-03 DIAGNOSIS — Z1159 Encounter for screening for other viral diseases: Secondary | ICD-10-CM | POA: Diagnosis not present

## 2017-08-03 DIAGNOSIS — E119 Type 2 diabetes mellitus without complications: Secondary | ICD-10-CM

## 2017-08-03 DIAGNOSIS — E559 Vitamin D deficiency, unspecified: Secondary | ICD-10-CM

## 2017-08-03 DIAGNOSIS — N6012 Diffuse cystic mastopathy of left breast: Secondary | ICD-10-CM | POA: Diagnosis not present

## 2017-08-03 DIAGNOSIS — I10 Essential (primary) hypertension: Secondary | ICD-10-CM | POA: Diagnosis not present

## 2017-08-03 MED ORDER — ENALAPRIL MALEATE 5 MG PO TABS: 5.0000 mg | ORAL_TABLET | Freq: Every day | ORAL | 3 refills | Status: DC

## 2017-08-03 MED ORDER — LANCETS MISC: 6 refills | Status: DC

## 2017-08-03 MED ORDER — GLUCOSE BLOOD VI STRP: ORAL_STRIP | 6 refills | Status: DC

## 2017-08-03 NOTE — Patient Instructions (Signed)
NEED BLOOD WORK TODAY I will send you a letter with your test results.  If there is anything of concern, we will call right away.  Take a fiber supplement like metamucil daily   prevnar today  Need mammogram and another dexa scan  Need to see me in a month

## 2017-08-03 NOTE — Progress Notes (Signed)
Chief Complaint  Patient presents with  . Diabetes    est care   This is a new patient to establish with the office. She previously got care at a free clinic, but is no longer eligible because she has Medicare She has well-controlled diabetes mellitus.  Her last hemoglobin A1c in June was 6.5.  She is compliant with her metformin.  She has not compliant with the diet.  She has not compliant with exercise.  She declines referral to a nutritionist. She is well controlled hypertension.  She is on an ACE inhibitor. She has controlled hyperlipidemia.  She is on a statin. She denies any cardiovascular complications, kidney disease, neuropathy, or eye disease from her diabetes.  She is overdue for an eye exam.  Is scheduled on January 10. She had a DEXA scan in 2007.  Was never repeated.  Showed osteopenia. She had a mammogram in 2017.  She is overdue for repeat.  She had abnormalities with fibrocystic breast disease. She has never had a colonoscopy.  She gets yearly stool testing.  We discussed that it is preferred to have a cologuard or colonoscopy. We discussed recommendations for screening for HIV and hepatitis C.  She agrees to get these today. She had a hysterectomy for benign reasons.  She no longer needs Pap smears. Her medical care is reviewed.  She is due for immunizations and receives a Prevnar today.  She needs to check with her insurance regarding getting a shingles shot series. Her medical history reveals a diagnosis of obstructive sleep apnea.  She states she does not have sleep apnea, she was never told she had sleep apnea , and she sleeps well.   Patient Active Problem List   Diagnosis Date Noted  . Obesity 09/06/2015  . Essential hypertension, benign 06/03/2015  . Hyperlipidemia 12/08/2008  . Diabetes ([place]) 06/09/2008  . MAMMOGRAM, ABNORMAL, LEFT 06/09/2008  . ANEMIA NOS 03/12/2007  . HYPERPARATHYROIDISM, PRIMARY 11/03/2006  . OSTEOPENIA 07/05/2006  . ALLERGIC RHINITIS  06/20/2006  . LOW BACK PAIN 06/20/2006  . INCONTINENCE 06/20/2006  . FIBROCYSTIC BREAST DISEASE 05/17/2006    Outpatient Encounter Medications as of 08/03/2017  Medication Sig  . aspirin 81 MG tablet Take 81 mg by mouth daily.  . enalapril (VASOTEC) 5 MG tablet Take 1 tablet (5 mg total) by mouth daily.  . metFORMIN (GLUCOPHAGE) 500 MG tablet TAKE ONE TABLET BY MOUTH TWICE DAILY  . Multiple Vitamin (MULTIVITAMINS PO) Take 1 tablet by mouth daily.  . Omega-3 Fatty Acids (FISH OIL) 1000 MG CAPS Take 2,000 mg by mouth 2 (two) times daily.  . simvastatin (ZOCOR) 40 MG tablet Take 1 tablet (40 mg total) by mouth at bedtime.  Marland Kitchen glucose blood test strip Test bid  . Lancets MISC Test bid   No facility-administered encounter medications on file as of 08/03/2017.     Past Medical History:  Diagnosis Date  . Allergy   . Anemia   . Diabetes mellitus without complication ([place])   . Hyperlipidemia   . Hypertension   . Sleep apnea     Past Surgical History:  Procedure Laterality Date  . ABDOMINAL HYSTERECTOMY     fibroids    Social History   Socioeconomic History  . Marital status: Divorced    Spouse name: Not on file  . Number of children: 2  . Years of education: Not on file  . Highest education level: 11th grade  Social Needs  . Financial resource strain: Not on file  .  Food insecurity - worry: Not on file  . Food insecurity - inability: Not on file  . Transportation needs - medical: Not on file  . Transportation needs - non-medical: Not on file  Occupational History  . Occupation: unemployed    Comment: caregiver  Tobacco Use  . Smoking status: Former Smoker    Years: 2.00    Types: Cigarettes    Start date: 08/02/1971    Last attempt to quit: 08/01/1986    Years since quitting: 31.0  . Smokeless tobacco: Never Used  Substance and Sexual Activity  . Alcohol use: No  . Drug use: No  . Sexual activity: No    Birth control/protection: Surgical  Other Topics Concern  . Not  on file  Social History Narrative   Lives with disabled mother with Alzheimer's   Also cares for daughter with [place]'s    Family History  Problem Relation Age of Onset  . Alzheimer's disease Mother   . Cancer Mother        unknown  . Alcohol abuse Father   . COPD Father   . Diabetes Sister   . Hypertension Sister   . Huntington's disease Daughter     Review of Systems  Constitutional: Negative for chills, fever and weight loss.  HENT: Negative for congestion and hearing loss.   Eyes: Positive for blurred vision. Negative for pain.  Respiratory: Negative for cough and shortness of breath.   Cardiovascular: Negative for chest pain and leg swelling.  Gastrointestinal: Positive for constipation. Negative for abdominal pain, diarrhea and heartburn.       Chronic constipation with occasional stool leakage  Genitourinary: Negative for dysuria and frequency.  Musculoskeletal: Negative for falls, joint pain and myalgias.       "Might be getting arthritis"  Neurological: Negative for dizziness, seizures and headaches.  Psychiatric/Behavioral: Negative.  Negative for depression. The patient is not nervous/anxious and does not have insomnia.        Sometimes stress as caregiver    BP 136/86 (BP Location: Left Arm, Patient Position: Sitting, Cuff Size: Normal)   Pulse 72   Temp 97.7 F (36.5 C) (Temporal)   Resp 16   Ht 5\' 7"  (1.702 m)   Wt 224 lb 1.9 oz (101.7 kg)   SpO2 99%   BMI 35.10 kg/m   Physical Exam  Constitutional: She appears well-developed and well-nourished.  Obese  HENT:  Head: Normocephalic and atraumatic.  Mouth/Throat: Oropharynx is clear and moist.  Eyes: Conjunctivae are normal. Pupils are equal, round, and reactive to light.  Neck: Normal range of motion. Neck supple. No thyromegaly present.  Cardiovascular: Normal rate, regular rhythm and normal heart sounds.  Pulmonary/Chest: Effort normal and breath sounds normal. No respiratory distress.    Abdominal: Soft. Bowel sounds are normal.  Musculoskeletal: Normal range of motion. She exhibits no edema.  Lymphadenopathy:    She has no cervical adenopathy.  Neurological: She is alert.  Gait normal  Skin: Skin is warm and dry.  Psychiatric: She has a normal mood and affect. Her behavior is normal.  Slow responses, poor memory  Nursing note and vitals reviewed.  ASSESSMENT/PLAN:  1. Essential hypertension, benign Controlled  2. Type 2 diabetes mellitus without complication, without long-term current use of insulin ([facility]) Controlled by history - CBC - COMPLETE METABOLIC PANEL WITH GFR - Hemoglobin A1c - Lipid panel - VITAMIN D 25 Hydroxy (Vit-D Deficiency, Fractures) - Microalbumin / creatinine urine ratio - Urinalysis, Routine w reflex microscopic  3.  Fibrocystic disease of left breast Due for mammogram  4. Hyperlipidemia, unspecified hyperlipidemia type On statin  5. Encounter for hepatitis C screening test for low risk patient Discussed - Hepatitis C antibody  6. Encounter for screening for HIV Discussed - HIV antibody  7. Vitamin D deficiency Remote history - VITAMIN D 25 Hydroxy (Vit-D Deficiency, Fractures)  8. Chronic constipation with overflow Discussed fiber, diet, walking, water, supplements   Patient Instructions  NEED BLOOD WORK TODAY I will send you a letter with your test results.  If there is anything of concern, we will call right away.  Take a fiber supplement like metamucil daily   prevnar today  Need mammogram and another dexa scan  Need to see me in a month    Claire Everts, MD

## 2017-08-04 ENCOUNTER — Encounter: Payer: Self-pay | Admitting: Family Medicine

## 2017-08-04 LAB — HEMOGLOBIN A1C
EAG (MMOL/L): 8.2 (calc)
HEMOGLOBIN A1C: 6.8 %{Hb} — AB (ref <5.7)
Mean Plasma Glucose: 148 (calc)

## 2017-08-04 LAB — COMPLETE METABOLIC PANEL WITH GFR
AG Ratio: 1.4 (calc) (ref 1.0–2.5)
ALKALINE PHOSPHATASE (APISO): 51 U/L (ref 33–130)
ALT: 19 U/L (ref 6–29)
AST: 18 U/L (ref 10–35)
Albumin: 4.2 g/dL (ref 3.6–5.1)
BUN: 8 mg/dL (ref 7–25)
CALCIUM: 9.6 mg/dL (ref 8.6–10.4)
CO2: 32 mmol/L (ref 20–32)
CREATININE: 0.81 mg/dL (ref 0.50–0.99)
Chloride: 103 mmol/L (ref 98–110)
GFR, EST NON AFRICAN AMERICAN: 76 mL/min/{1.73_m2} (ref >=60)
GFR, Est African American: 88 mL/min/{1.73_m2} (ref >=60)
GLUCOSE: 86 mg/dL (ref 65–99)
Globulin: 3.1 g/dL (calc) (ref 1.9–3.7)
Potassium: 4.3 mmol/L (ref 3.5–5.3)
Sodium: 140 mmol/L (ref 135–146)
Total Bilirubin: 0.6 mg/dL (ref 0.2–1.2)
Total Protein: 7.3 g/dL (ref 6.1–8.1)

## 2017-08-04 LAB — VITAMIN D 25 HYDROXY (VIT D DEFICIENCY, FRACTURES): Vit D, 25-Hydroxy: 23 ng/mL — ABNORMAL LOW (ref 30–100)

## 2017-08-04 LAB — URINALYSIS, ROUTINE W REFLEX MICROSCOPIC
Bilirubin Urine: NEGATIVE
Glucose, UA: NEGATIVE
HGB URINE DIPSTICK: NEGATIVE
KETONES UR: NEGATIVE
Leukocytes, UA: NEGATIVE
NITRITE: NEGATIVE
PH: 6 (ref 5.0–8.0)
PROTEIN: NEGATIVE
Specific Gravity, Urine: 1.02 (ref 1.001–1.03)

## 2017-08-04 LAB — LIPID PANEL
Cholesterol: 151 mg/dL (ref <200)
HDL: 37 mg/dL — AB (ref >50)
LDL Cholesterol (Calc): 90 mg/dL (calc)
NON-HDL CHOLESTEROL (CALC): 114 mg/dL (ref <130)
Total CHOL/HDL Ratio: 4.1 (calc) (ref <5.0)
Triglycerides: 140 mg/dL (ref <150)

## 2017-08-04 LAB — MICROALBUMIN / CREATININE URINE RATIO
CREATININE, URINE: 149 mg/dL (ref 20–275)
MICROALB UR: 3.9 mg/dL
MICROALB/CREAT RATIO: 26 ug/mg{creat} (ref <30)

## 2017-08-04 LAB — CBC
HEMATOCRIT: 36.6 % (ref 35.0–45.0)
HEMOGLOBIN: 11.9 g/dL (ref 11.7–15.5)
MCH: 26.3 pg — AB (ref 27.0–33.0)
MCHC: 32.5 g/dL (ref 32.0–36.0)
MCV: 81 fL (ref 80.0–100.0)
MPV: 9.8 fL (ref 7.5–12.5)
Platelets: 329 10*3/uL (ref 140–400)
RBC: 4.52 10*6/uL (ref 3.80–5.10)
RDW: 12.3 % (ref 11.0–15.0)
WBC: 5.3 10*3/uL (ref 3.8–10.8)

## 2017-08-04 LAB — HEPATITIS C ANTIBODY
Hepatitis C Ab: NONREACTIVE
SIGNAL TO CUT-OFF: 0.01 (ref <1.00)

## 2017-08-04 LAB — HIV ANTIBODY (ROUTINE TESTING W REFLEX): HIV 1&2 Ab, 4th Generation: NONREACTIVE

## 2017-08-10 ENCOUNTER — Telehealth: Payer: Self-pay | Admitting: Family Medicine

## 2017-08-10 DIAGNOSIS — Z780 Asymptomatic menopausal state: Secondary | ICD-10-CM

## 2017-08-10 DIAGNOSIS — Z1239 Encounter for other screening for malignant neoplasm of breast: Secondary | ICD-10-CM

## 2017-08-10 NOTE — Telephone Encounter (Signed)
Need orders for mammogram and dexa scan per scheduling request on 08/03/17

## 2017-08-14 ENCOUNTER — Other Ambulatory Visit: Payer: Self-pay | Admitting: Family Medicine

## 2017-08-14 DIAGNOSIS — Z1231 Encounter for screening mammogram for malignant neoplasm of breast: Secondary | ICD-10-CM

## 2017-08-22 ENCOUNTER — Other Ambulatory Visit: Payer: Self-pay | Admitting: Family Medicine

## 2017-08-23 ENCOUNTER — Ambulatory Visit (HOSPITAL_COMMUNITY)
Admission: RE | Admit: 2017-08-23 | Discharge: 2017-08-23 | Disposition: A | Discharge status: Home / Self Care | Payer: Medicare Other | Source: Ambulatory Visit | Attending: Family Medicine | Admitting: Family Medicine

## 2017-08-23 ENCOUNTER — Encounter (HOSPITAL_COMMUNITY): Payer: Self-pay

## 2017-08-23 DIAGNOSIS — M85852 Other specified disorders of bone density and structure, left thigh: Secondary | ICD-10-CM | POA: Diagnosis not present

## 2017-08-23 DIAGNOSIS — Z780 Asymptomatic menopausal state: Secondary | ICD-10-CM

## 2017-08-23 DIAGNOSIS — Z1231 Encounter for screening mammogram for malignant neoplasm of breast: Secondary | ICD-10-CM | POA: Diagnosis not present

## 2017-08-23 DIAGNOSIS — M85851 Other specified disorders of bone density and structure, right thigh: Secondary | ICD-10-CM | POA: Diagnosis not present

## 2017-08-23 DIAGNOSIS — M8580 Other specified disorders of bone density and structure, unspecified site: Secondary | ICD-10-CM | POA: Diagnosis not present

## 2017-08-28 ENCOUNTER — Other Ambulatory Visit: Payer: Self-pay | Admitting: Physician Assistant

## 2017-08-31 ENCOUNTER — Other Ambulatory Visit: Payer: Self-pay | Admitting: Physician Assistant

## 2017-09-04 ENCOUNTER — Ambulatory Visit (INDEPENDENT_AMBULATORY_CARE_PROVIDER_SITE_OTHER): Payer: Medicare Other | Admitting: Family Medicine

## 2017-09-04 ENCOUNTER — Encounter: Payer: Self-pay | Admitting: Family Medicine

## 2017-09-04 ENCOUNTER — Other Ambulatory Visit: Payer: Self-pay

## 2017-09-04 VITALS — BP 120/64 | HR 80 | Temp 96.9°F | Resp 16 | Ht 67.0 in | Wt 225.1 lb

## 2017-09-04 DIAGNOSIS — E785 Hyperlipidemia, unspecified: Secondary | ICD-10-CM | POA: Diagnosis not present

## 2017-09-04 DIAGNOSIS — M8580 Other specified disorders of bone density and structure, unspecified site: Secondary | ICD-10-CM

## 2017-09-04 DIAGNOSIS — E119 Type 2 diabetes mellitus without complications: Secondary | ICD-10-CM

## 2017-09-04 DIAGNOSIS — M810 Age-related osteoporosis without current pathological fracture: Secondary | ICD-10-CM | POA: Diagnosis not present

## 2017-09-04 DIAGNOSIS — I10 Essential (primary) hypertension: Secondary | ICD-10-CM | POA: Diagnosis not present

## 2017-09-04 MED ORDER — METFORMIN HCL 500 MG PO TABS: 500.0000 mg | ORAL_TABLET | Freq: Two times a day (BID) | ORAL | 3 refills | Status: DC

## 2017-09-04 NOTE — Progress Notes (Signed)
Chief Complaint  Patient presents with  . Follow-up    1 month   Routine follow-up. Labs reviewed. Diabetes well controlled.  Hemoglobin A1c 6.9.  Patient is overdue for eye exam.  Ophthalmology consult is placed.  No kidney problems.  No neuropathy problems. Her blood pressure is well controlled. Her lipids are well controlled with an LDL of 90 She had a normal mammogram recently She had a DEXA scan that showed mild osteopenia.  I discussed with her the importance of adequate calcium intake, vitamin D supplementation, regular weightbearing exercise She is never had a colonoscopy or cologuard.  She has had stool for occult blood randomly through the health department.  I recommend a colonoscopy or cologuard for screening.  Patient Active Problem List   Diagnosis Date Noted  . Obesity 09/06/2015  . Essential hypertension, benign 06/03/2015  . Hyperlipidemia 12/08/2008  . Diabetes ([place]) 06/09/2008  . MAMMOGRAM, ABNORMAL, LEFT 06/09/2008  . ANEMIA NOS 03/12/2007  . HYPERPARATHYROIDISM, PRIMARY 11/03/2006  . OSTEOPENIA 07/05/2006  . ALLERGIC RHINITIS 06/20/2006  . LOW BACK PAIN 06/20/2006  . INCONTINENCE 06/20/2006  . FIBROCYSTIC BREAST DISEASE 05/17/2006    Outpatient Encounter Medications as of 09/04/2017  Medication Sig  . aspirin 81 MG tablet Take 81 mg by mouth daily.  . enalapril (VASOTEC) 5 MG tablet Take 1 tablet (5 mg total) by mouth daily.  Marland Kitchen glucose blood test strip Test bid  . Lancets MISC Test bid  . metFORMIN (GLUCOPHAGE) 500 MG tablet Take 1 tablet (500 mg total) by mouth 2 (two) times daily.  . Multiple Vitamin (MULTIVITAMINS PO) Take 1 tablet by mouth daily.  . Omega-3 Fatty Acids (FISH OIL) 1000 MG CAPS Take 2,000 mg by mouth 2 (two) times daily.  . simvastatin (ZOCOR) 40 MG tablet TAKE ONE TABLET BY MOUTH AT BEDTIME FOR CHOLESTEROL.  . [DISCONTINUED] metFORMIN (GLUCOPHAGE) 500 MG tablet TAKE ONE TABLET BY MOUTH TWICE DAILY   No facility-administered  encounter medications on file as of 09/04/2017.     Allergies  Allergen Reactions  . Sulfonamide Derivatives Other (See Comments)    Does not recall    Review of Systems  Constitutional: Negative for activity change, appetite change and unexpected weight change.  HENT: Negative for congestion, dental problem, postnasal drip and rhinorrhea.   Eyes: Negative for redness and visual disturbance.  Respiratory: Negative for cough and shortness of breath.   Cardiovascular: Negative for chest pain, palpitations and leg swelling.  Gastrointestinal: Negative for abdominal pain, constipation and diarrhea.  Genitourinary: Negative for difficulty urinating, frequency and vaginal bleeding.  Musculoskeletal: Positive for back pain. Negative for arthralgias.       Occasional aches and pains from caring for disabled daughter  Neurological: Negative for dizziness and headaches.  Psychiatric/Behavioral: Negative for dysphoric mood and sleep disturbance. The patient is nervous/anxious.        Some caregiver stress    BP 120/64 (BP Location: Left Arm, Patient Position: Sitting, Cuff Size: Normal)   Pulse 80   Temp (!) 96.9 F (36.1 C) (Temporal)   Resp 16   Ht 5\' 7"  (1.702 m)   Wt 225 lb 1.9 oz (102.1 kg)   SpO2 97%   BMI 35.26 kg/m   Physical Exam  Constitutional: She is oriented to person, place, and time. She appears well-developed and well-nourished.  Overweight  HENT:  Head: Normocephalic and atraumatic.  Mouth/Throat: Oropharynx is clear and moist.  Eyes: Conjunctivae are normal. Pupils are equal, round, and reactive to  light.  Neck: Normal range of motion. Neck supple. No thyromegaly present.  Cardiovascular: Normal rate, regular rhythm and normal heart sounds.  Pulmonary/Chest: Effort normal and breath sounds normal. No respiratory distress.  Abdominal: Soft. Bowel sounds are normal.  Musculoskeletal: Normal range of motion. She exhibits no edema.  Lymphadenopathy:    She has no  cervical adenopathy.  Neurological: She is alert and oriented to person, place, and time.  Gait normal  Skin: Skin is warm and dry.  Psychiatric: She has a normal mood and affect. Her behavior is normal. Thought content normal.  Nursing note and vitals reviewed.   ASSESSMENT/PLAN:  1. Essential hypertension, benign Blood pressure well controlled  2. Type 2 diabetes mellitus without complication, without long-term current use of insulin (HCC) Diabetes well controlled - CBC - COMPLETE METABOLIC PANEL WITH GFR - Hemoglobin A1c - Lipid panel - Urinalysis, Routine w reflex microscopic - Ambulatory referral to Ophthalmology 4.  Hyperlipidemia Well controlled 5.  Osteopenia Discussed management and follow-up  Patient Instructions  You are doing well No change in medicines Diabetes very well controlled Blood pressure is good Try to take a little time for yourself  See me in six months Call sooner for problems  Eye referral placed    Raylene Everts, MD

## 2017-09-04 NOTE — Patient Instructions (Signed)
You are doing well No change in medicines Diabetes very well controlled Blood pressure is good Try to take a little time for yourself  See me in six months Call sooner for problems  Eye referral placed

## 2017-10-09 ENCOUNTER — Encounter: Payer: Self-pay | Admitting: Family Medicine

## 2017-11-16 ENCOUNTER — Other Ambulatory Visit: Payer: Self-pay

## 2017-11-16 ENCOUNTER — Telehealth: Payer: Self-pay | Admitting: Family Medicine

## 2017-11-16 MED ORDER — GLUCOSE BLOOD VI STRP: ORAL_STRIP | 0 refills | Status: DC

## 2017-11-16 NOTE — Telephone Encounter (Signed)
Pt called and LVM that she needs test strips, [phone]

## 2017-11-16 NOTE — Telephone Encounter (Signed)
Spoke with patient and let her know refill of strips called into [facility] for one month only. Informed patient she will need to find a new PCP and a letter had been mailed to her in March letting her know Dr.Nelson would be leaving the practice and a list of PCP's accepting new patients was included with the letter with verbal understanding.

## 2018-01-11 DIAGNOSIS — E119 Type 2 diabetes mellitus without complications: Secondary | ICD-10-CM | POA: Diagnosis not present

## 2018-01-11 DIAGNOSIS — E559 Vitamin D deficiency, unspecified: Secondary | ICD-10-CM | POA: Diagnosis not present

## 2018-01-11 DIAGNOSIS — I10 Essential (primary) hypertension: Secondary | ICD-10-CM | POA: Diagnosis not present

## 2018-01-11 DIAGNOSIS — E785 Hyperlipidemia, unspecified: Secondary | ICD-10-CM | POA: Diagnosis not present

## 2018-01-11 DIAGNOSIS — D510 Vitamin B12 deficiency anemia due to intrinsic factor deficiency: Secondary | ICD-10-CM | POA: Diagnosis not present

## 2018-03-05 ENCOUNTER — Ambulatory Visit: Payer: Medicare Other | Admitting: Family Medicine

## 2018-03-07 DIAGNOSIS — I10 Essential (primary) hypertension: Secondary | ICD-10-CM | POA: Diagnosis not present

## 2018-03-07 DIAGNOSIS — E119 Type 2 diabetes mellitus without complications: Secondary | ICD-10-CM | POA: Diagnosis not present

## 2018-06-07 DIAGNOSIS — E559 Vitamin D deficiency, unspecified: Secondary | ICD-10-CM | POA: Diagnosis not present

## 2018-06-07 DIAGNOSIS — E119 Type 2 diabetes mellitus without complications: Secondary | ICD-10-CM | POA: Diagnosis not present

## 2018-06-07 DIAGNOSIS — I10 Essential (primary) hypertension: Secondary | ICD-10-CM | POA: Diagnosis not present

## 2018-06-07 DIAGNOSIS — E785 Hyperlipidemia, unspecified: Secondary | ICD-10-CM | POA: Diagnosis not present

## 2018-06-07 DIAGNOSIS — Z23 Encounter for immunization: Secondary | ICD-10-CM | POA: Diagnosis not present

## 2018-09-10 DIAGNOSIS — E119 Type 2 diabetes mellitus without complications: Secondary | ICD-10-CM | POA: Diagnosis not present

## 2018-09-10 DIAGNOSIS — E785 Hyperlipidemia, unspecified: Secondary | ICD-10-CM | POA: Diagnosis not present

## 2018-09-10 DIAGNOSIS — Z1211 Encounter for screening for malignant neoplasm of colon: Secondary | ICD-10-CM | POA: Diagnosis not present

## 2018-09-10 DIAGNOSIS — K439 Ventral hernia without obstruction or gangrene: Secondary | ICD-10-CM | POA: Diagnosis not present

## 2018-09-13 ENCOUNTER — Other Ambulatory Visit (HOSPITAL_COMMUNITY): Payer: Self-pay | Admitting: Internal Medicine

## 2018-09-13 DIAGNOSIS — Z1231 Encounter for screening mammogram for malignant neoplasm of breast: Secondary | ICD-10-CM

## 2018-09-26 ENCOUNTER — Ambulatory Visit (HOSPITAL_COMMUNITY)
Admission: RE | Admit: 2018-09-26 | Discharge: 2018-09-26 | Disposition: A | Discharge status: Home / Self Care | Payer: Medicare Other | Source: Ambulatory Visit | Attending: Internal Medicine | Admitting: Internal Medicine

## 2018-09-26 DIAGNOSIS — Z1231 Encounter for screening mammogram for malignant neoplasm of breast: Secondary | ICD-10-CM | POA: Diagnosis not present

## 2018-12-11 ENCOUNTER — Ambulatory Visit (INDEPENDENT_AMBULATORY_CARE_PROVIDER_SITE_OTHER): Payer: Medicare Other | Admitting: Nurse Practitioner

## 2018-12-11 DIAGNOSIS — E785 Hyperlipidemia, unspecified: Secondary | ICD-10-CM | POA: Diagnosis not present

## 2018-12-11 DIAGNOSIS — E559 Vitamin D deficiency, unspecified: Secondary | ICD-10-CM | POA: Diagnosis not present

## 2018-12-11 DIAGNOSIS — E119 Type 2 diabetes mellitus without complications: Secondary | ICD-10-CM | POA: Diagnosis not present

## 2018-12-11 DIAGNOSIS — Z23 Encounter for immunization: Secondary | ICD-10-CM | POA: Diagnosis not present

## 2019-02-12 DIAGNOSIS — I10 Essential (primary) hypertension: Secondary | ICD-10-CM | POA: Diagnosis not present

## 2019-02-12 DIAGNOSIS — E559 Vitamin D deficiency, unspecified: Secondary | ICD-10-CM | POA: Diagnosis not present

## 2019-02-12 DIAGNOSIS — E785 Hyperlipidemia, unspecified: Secondary | ICD-10-CM | POA: Diagnosis not present

## 2019-02-12 DIAGNOSIS — E119 Type 2 diabetes mellitus without complications: Secondary | ICD-10-CM | POA: Diagnosis not present

## 2019-02-19 ENCOUNTER — Encounter (INDEPENDENT_AMBULATORY_CARE_PROVIDER_SITE_OTHER): Payer: Self-pay | Admitting: *Deleted

## 2019-05-07 ENCOUNTER — Other Ambulatory Visit (INDEPENDENT_AMBULATORY_CARE_PROVIDER_SITE_OTHER): Payer: Self-pay | Admitting: Nurse Practitioner

## 2019-05-29 ENCOUNTER — Ambulatory Visit (INDEPENDENT_AMBULATORY_CARE_PROVIDER_SITE_OTHER): Payer: Medicare Other | Admitting: Internal Medicine

## 2019-05-29 ENCOUNTER — Other Ambulatory Visit: Payer: Self-pay

## 2019-05-29 ENCOUNTER — Encounter (INDEPENDENT_AMBULATORY_CARE_PROVIDER_SITE_OTHER): Payer: Self-pay | Admitting: Internal Medicine

## 2019-05-29 VITALS — BP 130/70 | HR 73 | Temp 97.4°F | Resp 18 | Ht 66.0 in | Wt 218.0 lb

## 2019-05-29 DIAGNOSIS — I10 Essential (primary) hypertension: Secondary | ICD-10-CM

## 2019-05-29 DIAGNOSIS — E559 Vitamin D deficiency, unspecified: Secondary | ICD-10-CM | POA: Diagnosis not present

## 2019-05-29 DIAGNOSIS — E782 Mixed hyperlipidemia: Secondary | ICD-10-CM | POA: Diagnosis not present

## 2019-05-29 DIAGNOSIS — Z23 Encounter for immunization: Secondary | ICD-10-CM | POA: Diagnosis not present

## 2019-05-29 DIAGNOSIS — E119 Type 2 diabetes mellitus without complications: Secondary | ICD-10-CM | POA: Diagnosis not present

## 2019-05-29 MED ORDER — SIMVASTATIN 40 MG PO TABS: 40.0000 mg | ORAL_TABLET | Freq: Every day | ORAL | 0 refills | Status: DC

## 2019-05-29 NOTE — Progress Notes (Signed)
Metrics: Intervention Frequency ACO  Documented Smoking Status Yearly  Screened one or more times in 24 months  Cessation Counseling or  Active cessation medication Past 24 months  Past 24 months   Guideline developer: UpToDate (See UpToDate for funding source) Date Released: 2014       Wellness Office Visit  Subjective:  Patient ID: [name], female    DOB: July 22, 1951  Age: 67 y.o. MRN: [id number]  CC: This lady comes in for follow-up regarding her diabetes, hypertension, hyperlipidemia and morbid obesity. HPI  She is compliant with taking Metformin just once a day.  She denies any polyuria or polydipsia.  Her last hemoglobin A1c was over 7%. She continues on statin therapy for hyperlipidemia in the face of diabetes.  She denies any myalgias. She continues on enalapril for hypertension.  She denies a cough.  She denies any chest pain, dyspnea, palpitations or limb weakness. Past Medical History:  Diagnosis Date  . Allergy   . Anemia   . Diabetes mellitus without complication ([place])   . Hyperlipidemia   . Hypertension   . Sleep apnea       Family History  Problem Relation Age of Onset  . Alzheimer's disease Mother   . Cancer Mother        unknown  . Alcohol abuse Father   . COPD Father   . Diabetes Sister   . Hypertension Sister   . Huntington's disease Daughter     Social History   Social History Narrative   Lives with disabled mother with Alzheimer's   Also cares for daughter with [place]'s   Separated for 15 years.Lives with kids and mother.Retired.Previously in Comptroller.   Social History   Tobacco Use  . Smoking status: Former Smoker    Years: 2.00    Types: Cigarettes    Start date: 08/02/1971    Quit date: 08/01/1986    Years since quitting: 32.8  . Smokeless tobacco: Never Used  Substance Use Topics  . Alcohol use: No    Current Meds  Medication Sig  . aspirin 81 MG tablet Take 81 mg by mouth daily.  . Cholecalciferol  (VITAMIN D3) 50 MCG (2000 UT) TABS Take 1 tablet by mouth daily.  . enalapril (VASOTEC) 5 MG tablet Take 1 tablet by mouth once daily  . glucose blood test strip Test bid  . Lancets MISC Test bid  . metFORMIN (GLUCOPHAGE) 500 MG tablet Take 1 tablet (500 mg total) by mouth 2 (two) times daily. (Patient taking differently: Take 500 mg by mouth daily with breakfast. )  . Multiple Vitamin (MULTIVITAMINS PO) Take 1 tablet by mouth daily.  . Omega-3 Fatty Acids (FISH OIL) 1000 MG CAPS Take 2,000 mg by mouth 2 (two) times daily.  . simvastatin (ZOCOR) 40 MG tablet Take 1 tablet (40 mg total) by mouth daily at 6 PM.  . [DISCONTINUED] simvastatin (ZOCOR) 40 MG tablet TAKE ONE TABLET BY MOUTH AT BEDTIME FOR CHOLESTEROL.       Objective:   Today's Vitals: BP 130/70 (BP Location: Left Arm, Patient Position: Sitting, Cuff Size: Normal)   Pulse 73   Temp (!) 97.4 F (36.3 C) (Temporal)   Resp 18   Ht 5\' 6"  (1.676 m)   Wt 218 lb (98.9 kg)   SpO2 98% Comment: room air with mask.  BMI 35.19 kg/m  Vitals with BMI 05/29/2019 09/04/2017 08/03/2017  Height 5\' 6"  5\' 7"  5\' 7"   Weight 218 lbs 225 lbs 2 oz  224 lbs 2 oz  BMI 35.2 [id number]  Systolic [id number]  Diastolic 70 64 86  Pulse 73 80 72     Physical Exam   She looks systemically well and remains morbidly obese.  Blood pressure is well controlled.  She is alert and orientated without any focal neurological signs.    Assessment   1. Essential hypertension, benign   2. Type 2 diabetes mellitus without complication, without long-term current use of insulin ([place])   3. Mixed hyperlipidemia   4. Morbid obesity ([place])   5. Vitamin D deficiency disease       Tests ordered Orders Placed This Encounter  Procedures  . COMPLETE METABOLIC PANEL WITH GFR  . Hemoglobin A1c  . Lipid panel  . VITAMIN D 25 Hydroxy (Vit-D Deficiency, Fractures)     Plan: 1. She will continue with antihypertensive therapy which seems to be controlling her  blood pressure. 2. She will continue with Metformin and I will check her hemoglobin A1c. 3. She will continue with statin therapy and we will check lipid panel today. 4. She will continue also with vitamin D3 supplementation and we will check levels today. 5. She was given influenza vaccination today as well as pneumococcal 23. 6. Further recommendations will depend on blood results and she will follow-up with [name] early next year.   Meds ordered this encounter  Medications  . simvastatin (ZOCOR) 40 MG tablet    Sig: Take 1 tablet (40 mg total) by mouth daily at 6 PM.    Dispense:  90 tablet    Refill:  0    Evelynne Spiers Luther Parody, MD

## 2019-05-29 NOTE — Patient Instructions (Signed)

## 2019-05-30 ENCOUNTER — Encounter (INDEPENDENT_AMBULATORY_CARE_PROVIDER_SITE_OTHER): Payer: Self-pay | Admitting: Internal Medicine

## 2019-05-30 LAB — COMPLETE METABOLIC PANEL WITH GFR
AG Ratio: 1.4 (calc) (ref 1.0–2.5)
ALT: 13 U/L (ref 6–29)
AST: 18 U/L (ref 10–35)
Albumin: 4 g/dL (ref 3.6–5.1)
Alkaline phosphatase (APISO): 44 U/L (ref 37–153)
BUN: 11 mg/dL (ref 7–25)
CO2: 27 mmol/L (ref 20–32)
Calcium: 9.7 mg/dL (ref 8.6–10.4)
Chloride: 106 mmol/L (ref 98–110)
Creat: 0.84 mg/dL (ref 0.50–0.99)
GFR, Est African American: 84 mL/min/{1.73_m2} (ref >=60)
GFR, Est Non African American: 72 mL/min/{1.73_m2} (ref >=60)
Globulin: 2.8 g/dL (calc) (ref 1.9–3.7)
Glucose, Bld: 101 mg/dL — ABNORMAL HIGH (ref 65–99)
Potassium: 4.4 mmol/L (ref 3.5–5.3)
Sodium: 141 mmol/L (ref 135–146)
Total Bilirubin: 0.5 mg/dL (ref 0.2–1.2)
Total Protein: 6.8 g/dL (ref 6.1–8.1)

## 2019-05-30 LAB — HEMOGLOBIN A1C
Hgb A1c MFr Bld: 6.5 % of total Hgb — ABNORMAL HIGH (ref <5.7)
Mean Plasma Glucose: 140 (calc)
eAG (mmol/L): 7.7 (calc)

## 2019-05-30 LAB — LIPID PANEL
Cholesterol: 128 mg/dL (ref <200)
HDL: 36 mg/dL — ABNORMAL LOW (ref >=50)
LDL Cholesterol (Calc): 75 mg/dL (calc)
Non-HDL Cholesterol (Calc): 92 mg/dL (calc) (ref <130)
Total CHOL/HDL Ratio: 3.6 (calc) (ref <5.0)
Triglycerides: 89 mg/dL (ref <150)

## 2019-05-30 LAB — VITAMIN D 25 HYDROXY (VIT D DEFICIENCY, FRACTURES): Vit D, 25-Hydroxy: 37 ng/mL (ref 30–100)

## 2019-06-24 ENCOUNTER — Telehealth (INDEPENDENT_AMBULATORY_CARE_PROVIDER_SITE_OTHER): Payer: Self-pay | Admitting: Internal Medicine

## 2019-06-24 ENCOUNTER — Other Ambulatory Visit (INDEPENDENT_AMBULATORY_CARE_PROVIDER_SITE_OTHER): Payer: Self-pay | Admitting: Internal Medicine

## 2019-06-24 MED ORDER — METFORMIN HCL 500 MG PO TABS: 500.0000 mg | ORAL_TABLET | Freq: Two times a day (BID) | ORAL | 3 refills | Status: DC

## 2019-06-24 MED ORDER — METFORMIN HCL 500 MG PO TABS: 500.0000 mg | ORAL_TABLET | Freq: Every day | ORAL | 3 refills | Status: DC

## 2019-06-24 NOTE — Telephone Encounter (Signed)
Done

## 2019-07-01 ENCOUNTER — Other Ambulatory Visit (INDEPENDENT_AMBULATORY_CARE_PROVIDER_SITE_OTHER): Payer: Self-pay | Admitting: Internal Medicine

## 2019-07-01 DIAGNOSIS — E119 Type 2 diabetes mellitus without complications: Secondary | ICD-10-CM | POA: Diagnosis not present

## 2019-07-01 MED ORDER — METFORMIN HCL 500 MG PO TABS: 500.0000 mg | ORAL_TABLET | Freq: Every day | ORAL | 3 refills | Status: DC

## 2019-08-06 ENCOUNTER — Other Ambulatory Visit (INDEPENDENT_AMBULATORY_CARE_PROVIDER_SITE_OTHER): Payer: Self-pay | Admitting: Internal Medicine

## 2019-09-04 ENCOUNTER — Other Ambulatory Visit (INDEPENDENT_AMBULATORY_CARE_PROVIDER_SITE_OTHER): Payer: Self-pay | Admitting: Internal Medicine

## 2019-09-19 ENCOUNTER — Ambulatory Visit (INDEPENDENT_AMBULATORY_CARE_PROVIDER_SITE_OTHER): Payer: Medicare Other | Admitting: Nurse Practitioner

## 2019-10-03 ENCOUNTER — Ambulatory Visit: Payer: Medicare Other | Attending: Internal Medicine

## 2019-10-03 DIAGNOSIS — Z23 Encounter for immunization: Secondary | ICD-10-CM | POA: Insufficient documentation

## 2019-10-03 NOTE — Progress Notes (Signed)
   Covid-19 Vaccination Clinic  Name:  [name]    MRN: [id number] DOB: 20-Jul-1951  10/03/2019  [name] was observed post Covid-19 immunization for 15 minutes without incident. She was provided with Vaccine Information Sheet and instruction to access the V-Safe system.   [name] was instructed to call 911 with any severe reactions post vaccine: Marland Kitchen Difficulty breathing  . Swelling of face and throat  . A fast heartbeat  . A bad rash all over body  . Dizziness and weakness   Immunizations Administered    Name Date Dose VIS Date Route   Pfizer COVID-19 Vaccine 10/03/2019 10:42 AM 0.3 mL 07/12/2019 Intramuscular   Manufacturer: [place]   Lot: [id number]   [place]: [id number]

## 2019-10-14 ENCOUNTER — Encounter (INDEPENDENT_AMBULATORY_CARE_PROVIDER_SITE_OTHER): Payer: Self-pay | Admitting: Nurse Practitioner

## 2019-10-14 ENCOUNTER — Ambulatory Visit (INDEPENDENT_AMBULATORY_CARE_PROVIDER_SITE_OTHER): Payer: Medicare Other | Admitting: Nurse Practitioner

## 2019-10-14 ENCOUNTER — Other Ambulatory Visit: Payer: Self-pay

## 2019-10-14 VITALS — BP 140/78 | HR 83 | Temp 97.8°F | Ht 66.0 in | Wt 221.0 lb

## 2019-10-14 DIAGNOSIS — Z1211 Encounter for screening for malignant neoplasm of colon: Secondary | ICD-10-CM

## 2019-10-14 DIAGNOSIS — E119 Type 2 diabetes mellitus without complications: Secondary | ICD-10-CM | POA: Diagnosis not present

## 2019-10-14 DIAGNOSIS — Z0000 Encounter for general adult medical examination without abnormal findings: Secondary | ICD-10-CM | POA: Diagnosis not present

## 2019-10-14 DIAGNOSIS — E559 Vitamin D deficiency, unspecified: Secondary | ICD-10-CM

## 2019-10-14 DIAGNOSIS — E782 Mixed hyperlipidemia: Secondary | ICD-10-CM

## 2019-10-14 DIAGNOSIS — Z1231 Encounter for screening mammogram for malignant neoplasm of breast: Secondary | ICD-10-CM | POA: Diagnosis not present

## 2019-10-14 DIAGNOSIS — I10 Essential (primary) hypertension: Secondary | ICD-10-CM

## 2019-10-14 DIAGNOSIS — R42 Dizziness and giddiness: Secondary | ICD-10-CM | POA: Diagnosis not present

## 2019-10-14 IMAGING — MG DIGITAL SCREENING BILATERAL MAMMOGRAM WITH TOMO AND CAD
8 series · 8 of 24 positions shown · non-contrast
Comparison: Previous exam(s).

CLINICAL DATA: Screening.

EXAM:
DIGITAL SCREENING BILATERAL MAMMOGRAM WITH TOMO AND CAD

[L MLO synth-2D]
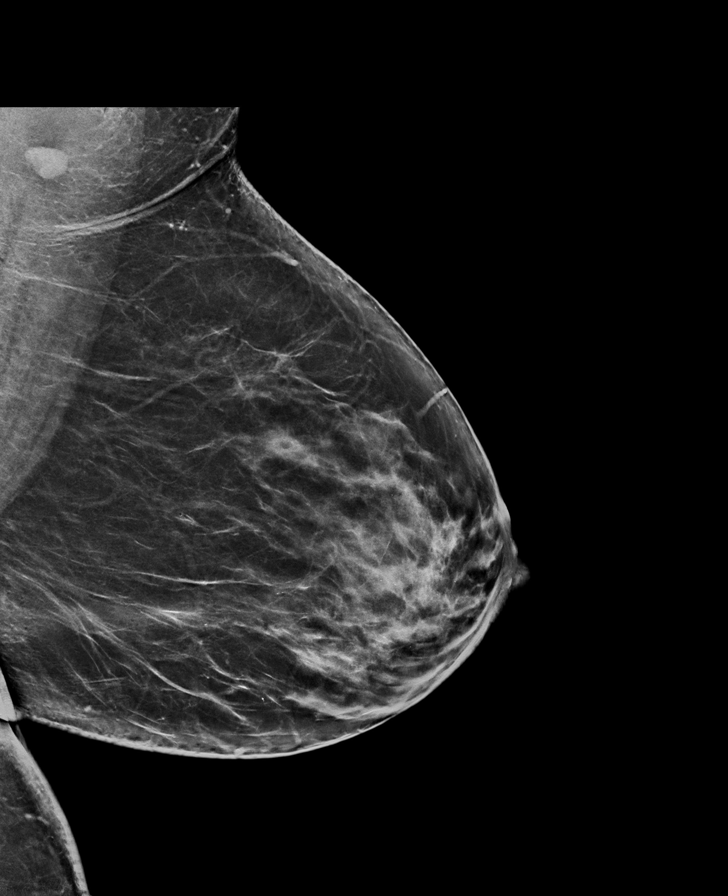

[L CC synth-2D]
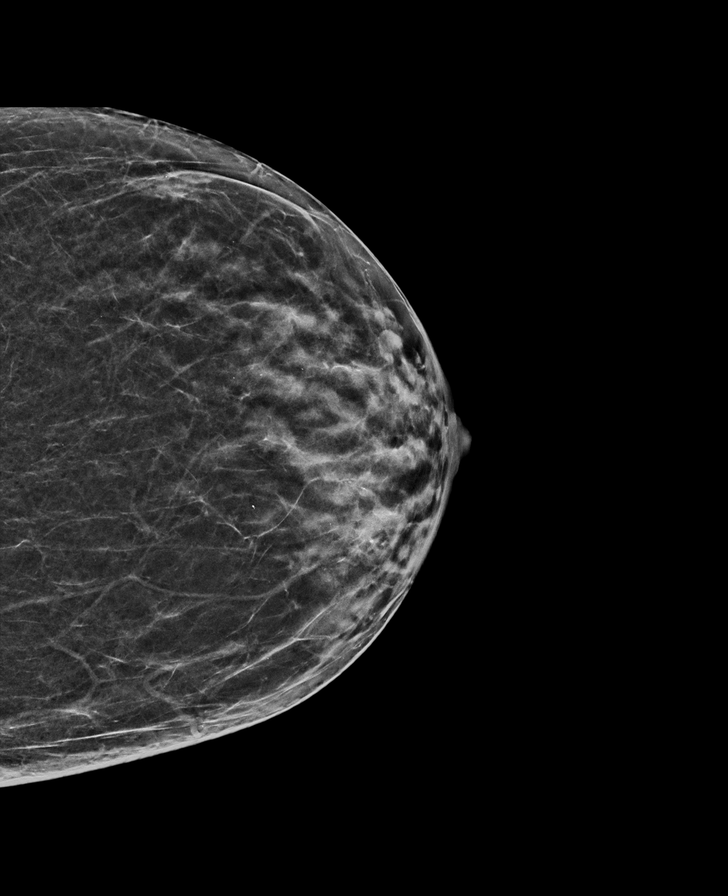

[R CC synth-2D]
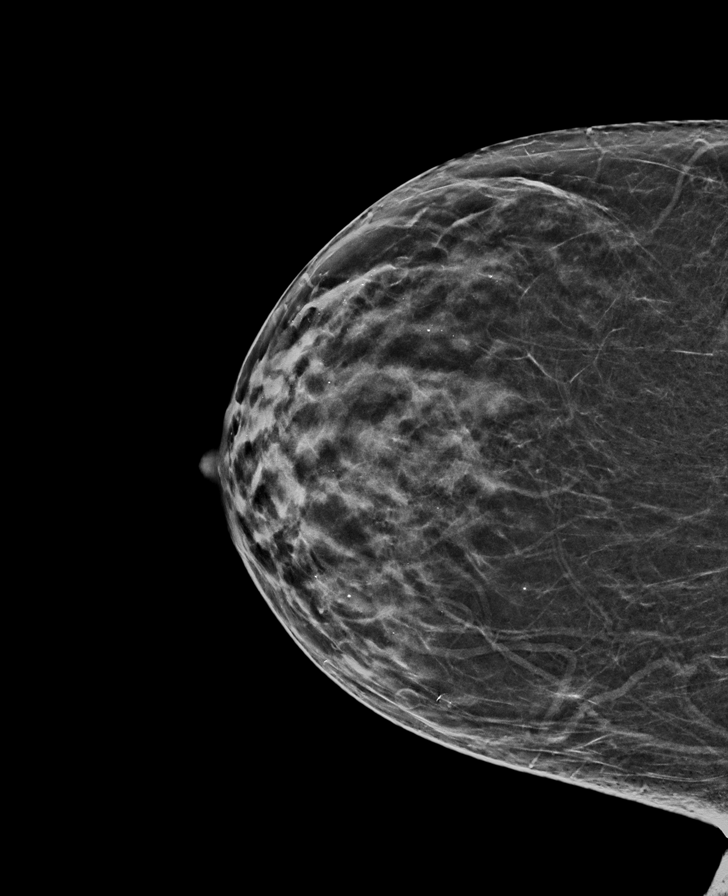

[R MLO synth-2D]
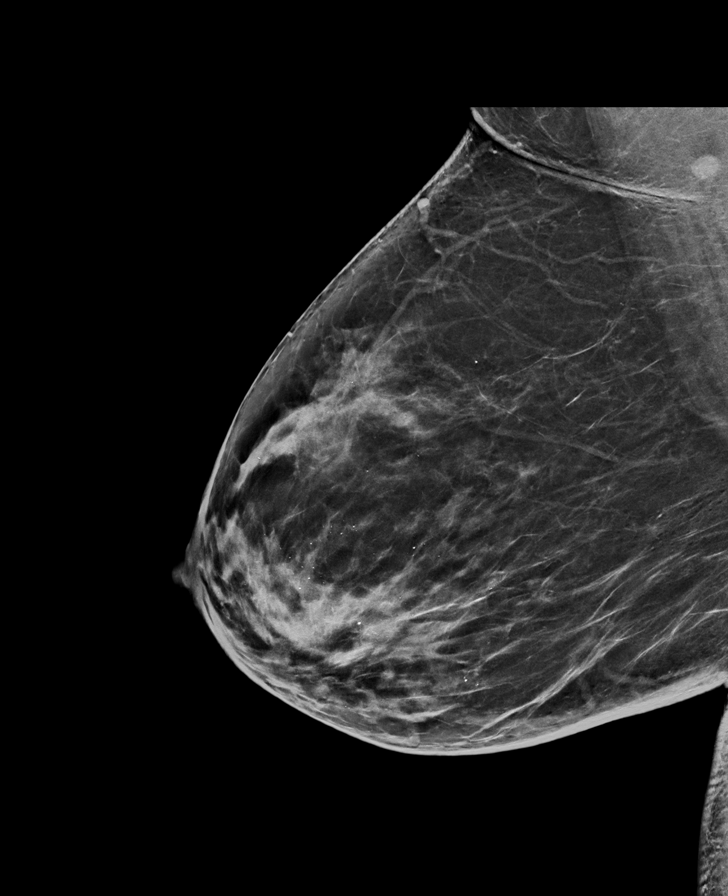

[R MLO tomo · tomo slice 37/72.0]
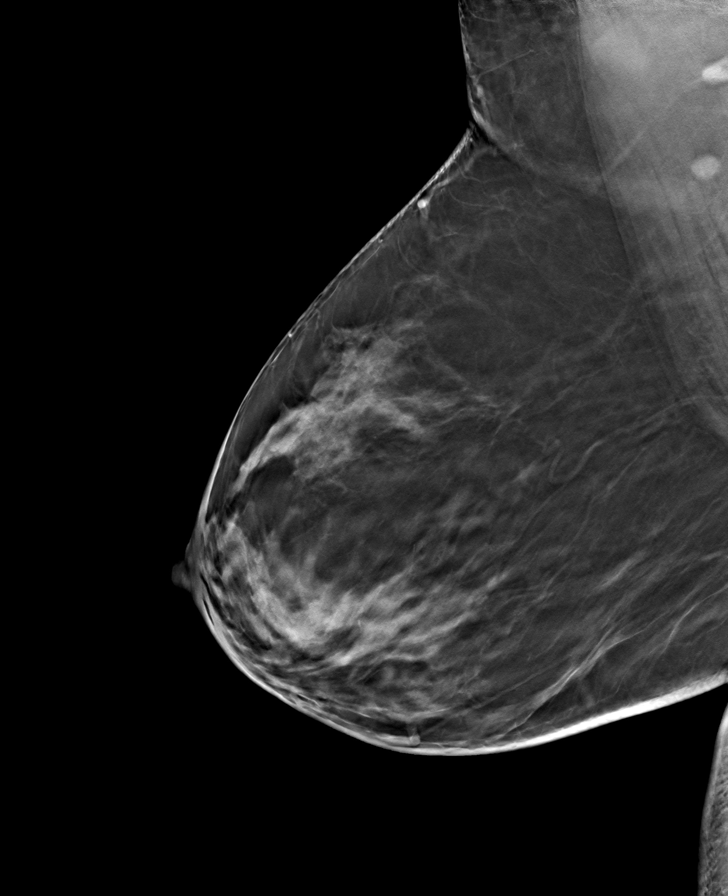

[L CC tomo · tomo slice 29/56.0]
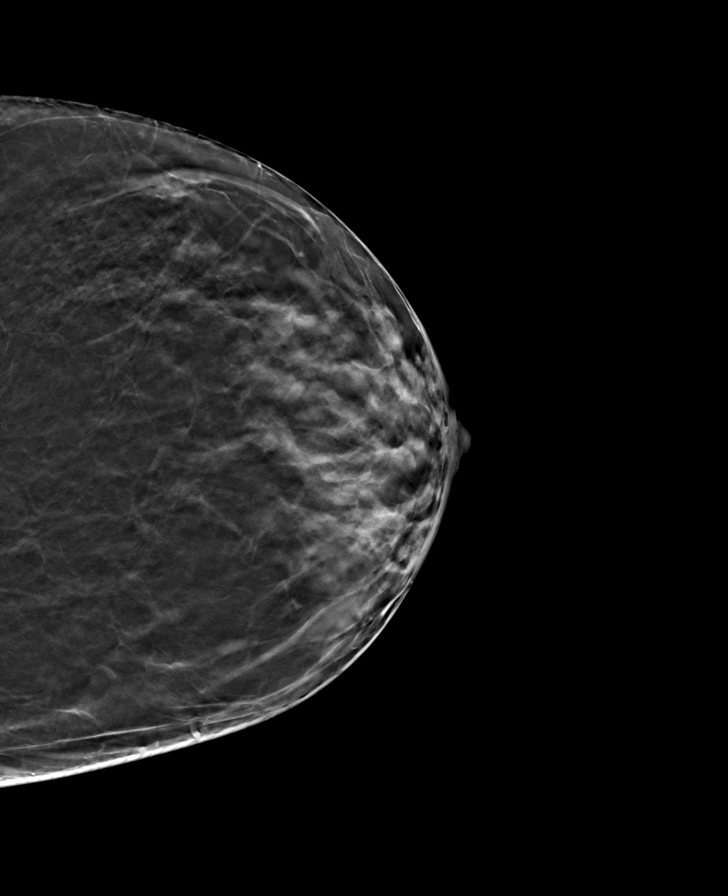

[R CC tomo · tomo slice 31/60.0]
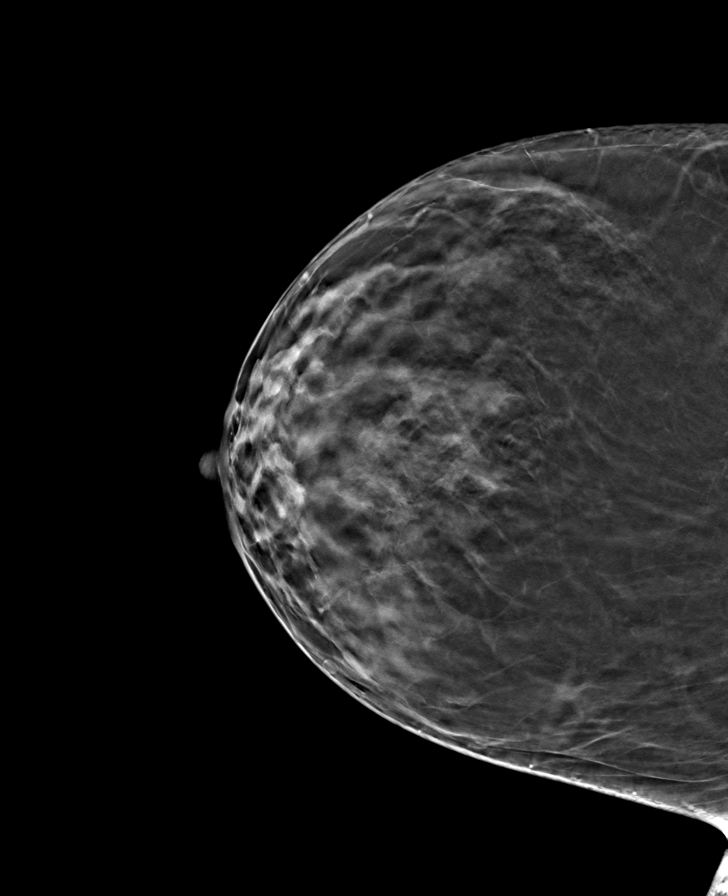

[L MLO tomo · tomo slice 38/75.0]
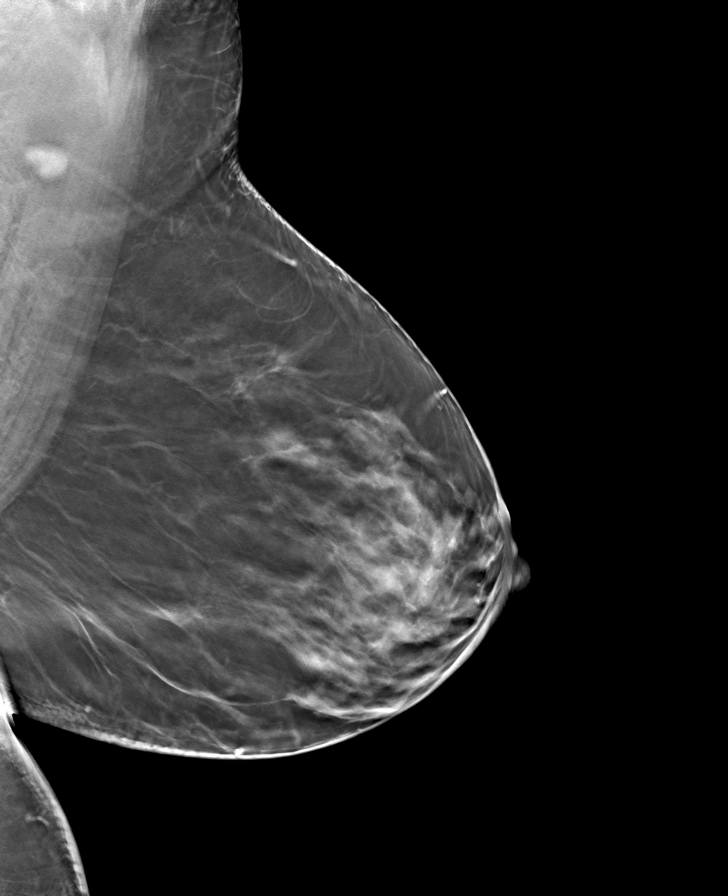

[8 of 24 positions shown; findings below may reference images not displayed]

ACR Breast Density Category c: The breast tissue is heterogeneously
dense, which may obscure small masses.
FINDINGS: There are no findings suspicious for malignancy. Images were
processed with CAD.
IMPRESSION: No mammographic evidence of malignancy. A result letter of this
screening mammogram will be mailed directly to the patient.

RECOMMENDATION:
Screening mammogram in one year. (Code:[id number]-U-LHB)

BI-RADS CATEGORY  1: Negative.

## 2019-10-14 MED ORDER — BLOOD GLUCOSE MONITOR KIT: PACK | 0 refills | Status: DC

## 2019-10-14 NOTE — Progress Notes (Signed)
Subjective:   [name] is a 69 y.o. female who presents for Medicare Annual (Subsequent) preventive examination.  Review of Systems:  Positive for dizziness, negative for headache, chest pain, palpitations, difficulties with breathing. Cardiac Risk Factors include: none  She tells me she has been experiencing some dizziness when she first wakes up and gets out of bed in the morning.  This is been going on for approximately 2 months.  She denies any new medication changes.  She denies any chest pain, or cardiac palpitations.  She denies any near syncope.  She tells me that the dizziness will spontaneously resolve within 5 minutes of her sitting up.  She denies any repeat dizziness with position changes throughout the day.     Objective:     Vitals: BP 140/78 (BP Location: Left Arm, Patient Position: Sitting, Cuff Size: Normal)   Pulse 83   Temp 97.8 F (36.6 C) (Temporal)   Ht 5\' 6"  (1.676 m)   Wt 221 lb (100.2 kg)   SpO2 95%   BMI 35.67 kg/m   Body mass index is 35.67 kg/m.  Advanced Directives 10/14/2019  Does Patient Have a Medical Advance Directive? No  Would patient like information on creating a medical advance directive? No - Patient declined    Tobacco Social History   Tobacco Use  Smoking Status Former Smoker  . Years: 2.00  . Types: Cigarettes  . Start date: 08/02/1971  . Quit date: 08/01/1986  . Years since quitting: 33.2  Smokeless Tobacco Never Used     Counseling given: Not Answered   Clinical Intake:     Pain : No/denies pain     Nutritional Status: BMI > 30  Obese Diabetes: Yes CBG done?: No Did pt. bring in CBG monitor from home?: Yes Glucose Meter Downloaded?: No  How often do you need to have someone help you when you read instructions, pamphlets, or other written materials from your doctor or pharmacy?: 1 - Never What is the last grade level you completed in school?: 12  Interpreter Needed?: No  Information entered by ::  Awilda Bill, CMA  Past Medical History:  Diagnosis Date  . Allergy   . Anemia   . Diabetes mellitus without complication ([place])   . Hyperlipidemia   . Hypertension   . Sleep apnea    Past Surgical History:  Procedure Laterality Date  . ABDOMINAL HYSTERECTOMY     fibroids  . fatty tumor     removed   Family History  Problem Relation Age of Onset  . Alzheimer's disease Mother   . Cancer Mother        unknown  . Alcohol abuse Father   . COPD Father   . Diabetes Sister   . Hypertension Sister   . Huntington's disease Daughter    Social History   Socioeconomic History  . Marital status: Divorced    Spouse name: Not on file  . Number of children: 2  . Years of education: Not on file  . Highest education level: 11th grade  Occupational History  . Occupation: unemployed    Comment: caregiver  Tobacco Use  . Smoking status: Former Smoker    Years: 2.00    Types: Cigarettes    Start date: 08/02/1971    Quit date: 08/01/1986    Years since quitting: 33.2  . Smokeless tobacco: Never Used  Substance and Sexual Activity  . Alcohol use: No  . Drug use: No  . Sexual activity:  Never    Birth control/protection: Surgical  Other Topics Concern  . Not on file  Social History Narrative   Lives with disabled mother with Alzheimer's   Also cares for daughter with [place]'s   Separated for 15 years.Lives with kids and mother.Retired.Previously in Comptroller.   Social Determinants of Health   Financial Resource Strain:   . Difficulty of Paying Living Expenses:   Food Insecurity:   . Worried About Charity fundraiser in the Last Year:   . Arboriculturist in the Last Year:   Transportation Needs:   . Film/video editor (Medical):   Marland Kitchen Lack of Transportation (Non-Medical):   Physical Activity:   . Days of Exercise per Week:   . Minutes of Exercise per Session:   Stress:   . Feeling of Stress :   Social Connections:   . Frequency of Communication with  Friends and Family:   . Frequency of Social Gatherings with Friends and Family:   . Attends Religious Services:   . Active Member of Clubs or Organizations:   . Attends Archivist Meetings:   Marland Kitchen Marital Status:     Outpatient Encounter Medications as of 10/14/2019  Medication Sig  . aspirin 81 MG tablet Take 81 mg by mouth daily.  . Cholecalciferol (VITAMIN D3) 50 MCG (2000 UT) TABS Take 1 tablet by mouth daily.  . enalapril (VASOTEC) 5 MG tablet Take 1 tablet by mouth once daily  . glucose blood test strip Test bid  . Lancets MISC Test bid  . metFORMIN (GLUCOPHAGE) 500 MG tablet Take 1 tablet (500 mg total) by mouth daily with breakfast.  . Multiple Vitamin (MULTIVITAMINS PO) Take 1 tablet by mouth daily.  . Omega-3 Fatty Acids (FISH OIL) 1000 MG CAPS Take 2,000 mg by mouth 2 (two) times daily.  . simvastatin (ZOCOR) 40 MG tablet TAKE 1 TABLET BY MOUTH ONCE DAILY AT  6  PM   No facility-administered encounter medications on file as of 10/14/2019.    Activities of Daily Living In your present state of health, do you have any difficulty performing the following activities: 10/14/2019  Hearing? N  Vision? Y  Comment wears glasses  Difficulty concentrating or making decisions? N  Walking or climbing stairs? N  Dressing or bathing? N  Doing errands, shopping? N  Preparing Food and eating ? N  Using the Toilet? N  In the past six months, have you accidently leaked urine? N  Do you have problems with loss of bowel control? N  Managing your Medications? N  Managing your Finances? N  Housekeeping or managing your Housekeeping? N  Some recent data might be hidden    Patient Care Team: Doree Albee, MD as PCP - General (Internal Medicine)    Assessment:   This is a routine wellness examination for [name].  Exercise Activities and Dietary recommendations Current Exercise Habits: The patient does not participate in regular exercise at present, Exercise limited by:  None identified  Goals    . HEMOGLOBIN A1C < 7.0    . Weight (lb) < 200 lb (90.7 kg)       Fall Risk Fall Risk  10/14/2019 08/03/2017  Falls in the past year? 0 No  Number falls in past yr: 0 -  Injury with Fall? 0 -  Risk for fall due to : No Fall Risks -  Follow up Falls evaluation completed -   Is the patient's home free of loose throw  rugs in walkways, pet beds, electrical cords, etc?   yes      Grab bars in the bathroom? yes      Handrails on the stairs?   no - N/A no stairs in or outside home      Adequate lighting?   yes  Timed Get Up and Go performed: 7 seconds  Depression Screen PHQ 2/9 Scores 10/14/2019 08/03/2017  PHQ - 2 Score 0 0  Exception Documentation Medical reason -     Cognitive Function     6CIT Screen 10/14/2019  What Year? 0 points  What month? 0 points  What time? 0 points  Count back from 20 0 points  Months in reverse 2 points  Repeat phrase 0 points  Total Score 2    Immunization History  Administered Date(s) Administered  . Fluad Quad(high Dose 65+) 05/29/2019  . Influenza Split 05/05/2015, 05/03/2016  . Influenza Whole 05/21/2007  . Influenza-Unspecified 05/02/2017  . PFIZER SARS-COV-2 Vaccination 10/03/2019  . Pneumococcal Conjugate-13 08/03/2017  . Pneumococcal Polysaccharide-23 09/08/2008  . Tdap 05/29/2019    Qualifies for Shingles Vaccine? Yes  Screening Tests Health Maintenance  Topic Date Due  . COLONOSCOPY  Never done  . OPHTHALMOLOGY EXAM  06/09/2017  . FOOT EXAM  08/03/2018  . HEMOGLOBIN A1C  11/27/2019  . DEXA SCAN  08/23/2020  . MAMMOGRAM  09/26/2020  . TETANUS/TDAP  05/28/2029  . INFLUENZA VACCINE  Completed  . Hepatitis C Screening  Completed  . PNA vac Low Risk Adult  Completed    Cancer Screenings: Lung: Low Dose CT Chest recommended if Age 53-80 years, 30 pack-year currently smoking OR have quit w/in 15years. Patient does not qualify. Breast:  Up to date on Mammogram? Yes   Up to date of Bone Density/Dexa?  Yes Colorectal: Due now  Additional Screenings:  Hepatitis C Screening: Completed and negative     Plan:   I will collect blood work for further evaluation of her symptoms of dizziness.  She will return to the office in approximately 1 to 2 weeks for a more in-depth conversation and evaluation.  I do not note any red flag symptoms today.  I did encourage her to let us know if her symptoms worsen in any way between now and her follow-up.  She is due for the Pneumovax vaccine, however she is in the middle of her Covid 19 vaccine series.  I will recommend that she get the Pneumovax administered once the COVID-19 vaccine series has been completed for at least 2 weeks.  She is due for colon cancer screening, and is willing to undergo consultation and possibly consider colonoscopy.  I will refer her to gastroenterology for this. Depression screening and fall screening were negative today.  She will be due for bone density scan again in January 2022.  She will be due for breast cancer screening via mammography again in February 2022, I did offer to send her for a mammogram now as it has been over a year since her last mammogram, but she would prefer to wait until next year.  I have personally reviewed and noted the following in the patient's chart:   . Medical and social history . Use of alcohol, tobacco or illicit drugs  . Current medications and supplements . Functional ability and status . Nutritional status . Physical activity . Advanced directives . List of other physicians . Hospitalizations, surgeries, and ER visits in previous 12 months . Vitals . Screenings to include cognitive, depression, and  falls . Referrals and appointments  In addition, I have reviewed and discussed with patient certain preventive protocols, quality metrics, and best practice recommendations. A written personalized care plan for preventive services as well as general preventive health recommendations were provided  to patient.    As stated above she will follow-up in approximately 2 weeks.  Rodney Langton, [place]  10/14/2019

## 2019-10-14 NOTE — Patient Instructions (Signed)
  [name] , Thank you for taking time to come for your Medicare Wellness Visit. I appreciate your ongoing commitment to your health goals. Please review the following plan we discussed and let me know if I can assist you in the future.   These are the goals we discussed: Goals    . HEMOGLOBIN A1C < 7.0    . Weight (lb) < 200 lb (90.7 kg)       This is a list of the screening recommended for you and due dates:  Health Maintenance  Topic Date Due  . Colon Cancer Screening  Never done  . Eye exam for diabetics  06/09/2017  . Complete foot exam   08/03/2018  . Hemoglobin A1C  11/27/2019  . DEXA scan (bone density measurement)  08/23/2020  . Mammogram  09/26/2020  . Tetanus Vaccine  05/28/2029  . Flu Shot  Completed  .  Hepatitis C: One time screening is recommended by Center for Disease Control  (CDC) for  adults born from [birth year] through 1964.   Completed  . Pneumonia vaccines  Completed

## 2019-10-14 NOTE — Addendum Note (Signed)
Addended by: Jeralyn Ruths E on: 10/14/2019 11:02 AM   Modules accepted: Orders

## 2019-10-15 ENCOUNTER — Encounter: Payer: Self-pay | Admitting: Internal Medicine

## 2019-10-15 LAB — COMPLETE METABOLIC PANEL WITH GFR
AG Ratio: 1.3 (calc) (ref 1.0–2.5)
ALT: 15 U/L (ref 6–29)
AST: 18 U/L (ref 10–35)
Albumin: 4 g/dL (ref 3.6–5.1)
Alkaline phosphatase (APISO): 46 U/L (ref 37–153)
BUN: 11 mg/dL (ref 7–25)
CO2: 33 mmol/L — ABNORMAL HIGH (ref 20–32)
Calcium: 9.9 mg/dL (ref 8.6–10.4)
Chloride: 105 mmol/L (ref 98–110)
Creat: 0.98 mg/dL (ref 0.50–0.99)
GFR, Est African American: 69 mL/min/{1.73_m2} (ref >=60)
GFR, Est Non African American: 60 mL/min/{1.73_m2} (ref >=60)
Globulin: 3 g/dL (calc) (ref 1.9–3.7)
Glucose, Bld: 113 mg/dL — ABNORMAL HIGH (ref 65–99)
Potassium: 4.6 mmol/L (ref 3.5–5.3)
Sodium: 142 mmol/L (ref 135–146)
Total Bilirubin: 0.5 mg/dL (ref 0.2–1.2)
Total Protein: 7 g/dL (ref 6.1–8.1)

## 2019-10-15 LAB — MICROALBUMIN / CREATININE URINE RATIO
Creatinine, Urine: 175 mg/dL (ref 20–275)
Microalb Creat Ratio: 12 mcg/mg creat (ref <30)
Microalb, Ur: 2.1 mg/dL

## 2019-10-15 LAB — CBC
HCT: 38.5 % (ref 35.0–45.0)
Hemoglobin: 12.3 g/dL (ref 11.7–15.5)
MCH: 26.8 pg — ABNORMAL LOW (ref 27.0–33.0)
MCHC: 31.9 g/dL — ABNORMAL LOW (ref 32.0–36.0)
MCV: 83.9 fL (ref 80.0–100.0)
MPV: 9.5 fL (ref 7.5–12.5)
Platelets: 306 10*3/uL (ref 140–400)
RBC: 4.59 10*6/uL (ref 3.80–5.10)
RDW: 12.5 % (ref 11.0–15.0)
WBC: 5 10*3/uL (ref 3.8–10.8)

## 2019-10-15 LAB — LIPID PANEL
Cholesterol: 150 mg/dL (ref <200)
HDL: 35 mg/dL — ABNORMAL LOW (ref >=50)
LDL Cholesterol (Calc): 90 mg/dL (calc)
Non-HDL Cholesterol (Calc): 115 mg/dL (calc) (ref <130)
Total CHOL/HDL Ratio: 4.3 (calc) (ref <5.0)
Triglycerides: 148 mg/dL (ref <150)

## 2019-10-15 LAB — VITAMIN D 25 HYDROXY (VIT D DEFICIENCY, FRACTURES): Vit D, 25-Hydroxy: 30 ng/mL (ref 30–100)

## 2019-10-15 LAB — T4, FREE: Free T4: 1.2 ng/dL (ref 0.8–1.8)

## 2019-10-15 LAB — T3, FREE: T3, Free: 3.3 pg/mL (ref 2.3–4.2)

## 2019-10-15 LAB — HEMOGLOBIN A1C
Hgb A1c MFr Bld: 7 % of total Hgb — ABNORMAL HIGH (ref <5.7)
Mean Plasma Glucose: 154 (calc)
eAG (mmol/L): 8.5 (calc)

## 2019-10-15 LAB — TSH: TSH: 1.86 mIU/L (ref 0.40–4.50)

## 2019-10-30 ENCOUNTER — Ambulatory Visit (INDEPENDENT_AMBULATORY_CARE_PROVIDER_SITE_OTHER): Payer: Medicare Other | Admitting: Internal Medicine

## 2019-10-30 ENCOUNTER — Ambulatory Visit: Payer: Medicare Other | Attending: Internal Medicine

## 2019-10-30 DIAGNOSIS — Z23 Encounter for immunization: Secondary | ICD-10-CM

## 2019-10-30 NOTE — Progress Notes (Signed)
   Covid-19 Vaccination Clinic  Name:  [name]    MRN: [id number] DOB: 1951-06-29  10/30/2019  [name] was observed post Covid-19 immunization for 15 minutes without incident. She was provided with Vaccine Information Sheet and instruction to access the V-Safe system.   [name] was instructed to call 911 with any severe reactions post vaccine: Marland Kitchen Difficulty breathing  . Swelling of face and throat  . A fast heartbeat  . A bad rash all over body  . Dizziness and weakness   Immunizations Administered    Name Date Dose VIS Date Route   Pfizer COVID-19 Vaccine 10/30/2019 10:00 AM 0.3 mL 07/12/2019 Intramuscular   Manufacturer: [employer]   Lot: [id number]   [place]: [id number]

## 2019-11-06 ENCOUNTER — Encounter (INDEPENDENT_AMBULATORY_CARE_PROVIDER_SITE_OTHER): Payer: Self-pay | Admitting: Nurse Practitioner

## 2019-11-06 ENCOUNTER — Other Ambulatory Visit (INDEPENDENT_AMBULATORY_CARE_PROVIDER_SITE_OTHER): Payer: Self-pay | Admitting: Nurse Practitioner

## 2019-11-06 ENCOUNTER — Other Ambulatory Visit (INDEPENDENT_AMBULATORY_CARE_PROVIDER_SITE_OTHER): Payer: Self-pay | Admitting: Internal Medicine

## 2019-11-06 DIAGNOSIS — E119 Type 2 diabetes mellitus without complications: Secondary | ICD-10-CM

## 2019-11-06 MED ORDER — BLOOD GLUCOSE MONITOR KIT: PACK | 11 refills | Status: DC

## 2019-11-06 NOTE — Progress Notes (Signed)
This encounter was created in error - please disregard.

## 2019-11-11 ENCOUNTER — Other Ambulatory Visit (INDEPENDENT_AMBULATORY_CARE_PROVIDER_SITE_OTHER): Payer: Self-pay | Admitting: Nurse Practitioner

## 2019-11-11 DIAGNOSIS — E119 Type 2 diabetes mellitus without complications: Secondary | ICD-10-CM

## 2019-11-11 MED ORDER — BLOOD GLUCOSE MONITOR KIT: PACK | 11 refills | Status: DC

## 2019-11-26 ENCOUNTER — Ambulatory Visit (INDEPENDENT_AMBULATORY_CARE_PROVIDER_SITE_OTHER): Payer: Medicare Other | Admitting: Internal Medicine

## 2019-12-04 ENCOUNTER — Ambulatory Visit: Payer: Medicare Other

## 2019-12-04 ENCOUNTER — Other Ambulatory Visit (INDEPENDENT_AMBULATORY_CARE_PROVIDER_SITE_OTHER): Payer: Self-pay | Admitting: Nurse Practitioner

## 2019-12-24 ENCOUNTER — Other Ambulatory Visit (INDEPENDENT_AMBULATORY_CARE_PROVIDER_SITE_OTHER): Payer: Self-pay

## 2019-12-25 MED ORDER — METFORMIN HCL 500 MG PO TABS: 500.0000 mg | ORAL_TABLET | Freq: Every day | ORAL | 0 refills | Status: DC

## 2019-12-31 ENCOUNTER — Encounter (INDEPENDENT_AMBULATORY_CARE_PROVIDER_SITE_OTHER): Payer: Self-pay | Admitting: Internal Medicine

## 2019-12-31 ENCOUNTER — Other Ambulatory Visit: Payer: Self-pay

## 2019-12-31 ENCOUNTER — Ambulatory Visit (INDEPENDENT_AMBULATORY_CARE_PROVIDER_SITE_OTHER): Payer: Medicare Other | Admitting: Internal Medicine

## 2019-12-31 VITALS — BP 150/85 | HR 72 | Temp 98.1°F | Ht 66.0 in | Wt 224.8 lb

## 2019-12-31 DIAGNOSIS — E119 Type 2 diabetes mellitus without complications: Secondary | ICD-10-CM

## 2019-12-31 DIAGNOSIS — E559 Vitamin D deficiency, unspecified: Secondary | ICD-10-CM

## 2019-12-31 DIAGNOSIS — E782 Mixed hyperlipidemia: Secondary | ICD-10-CM

## 2019-12-31 DIAGNOSIS — I10 Essential (primary) hypertension: Secondary | ICD-10-CM

## 2019-12-31 NOTE — Patient Instructions (Signed)
[name] Dietary Recommendations for Weight Loss What to Avoid . Avoid added sugars o Often added sugar can be found in processed foods such as many condiments, dry cereals, cakes, cookies, chips, crisps, crackers, candies, sweetened drinks, etc.  o Read labels and AVOID/DECREASE use of foods with the following in their ingredient list: Sugar, fructose, high fructose corn syrup, sucrose, glucose, maltose, dextrose, molasses, cane sugar, brown sugar, any type of syrup, agave nectar, etc.   . Avoid snacking in between meals . Avoid foods made with flour o If you are going to eat food made with flour, choose those made with whole-grains; and, minimize your consumption as much as is tolerable . Avoid processed foods o These foods are generally stocked in the middle of the grocery store. Focus on shopping on the perimeter of the grocery.  . Avoid Meat  o We recommend following a plant-based diet at [name]. Thus, we recommend avoiding meat as a general rule. Consider eating beans, legumes, eggs, and/or dairy products for regular protein sources o If you plan on eating meat limit to 4 ounces of meat at a time and choose lean options such as Fish, chicken, turkey. Avoid red meat intake such as pork and/or steak What to Include . Vegetables o GREEN LEAFY VEGETABLES: Kale, spinach, mustard greens, collard greens, cabbage, broccoli, etc. o OTHER: Asparagus, cauliflower, eggplant, carrots, peas, Brussel sprouts, tomatoes, bell peppers, zucchini, beets, cucumbers, etc. . Grains, seeds, and legumes o Beans: kidney beans, black eyed peas, garbanzo beans, black beans, pinto beans, etc. o Whole, unrefined grains: brown rice, barley, bulgur, oatmeal, etc. . Healthy fats  o Avoid highly processed fats such as vegetable oil o Examples of healthy fats: avocado, olives, virgin olive oil, dark chocolate (?72% Cocoa), nuts (peanuts, almonds, walnuts, cashews, pecans, etc.) . None to Low  Intake of Animal Sources of Protein o Meat sources: chicken, turkey, salmon, tuna. Limit to 4 ounces of meat at one time. o Consider limiting dairy sources, but when choosing dairy focus on: PLAIN Greek yogurt, cottage cheese, high-protein milk . Fruit o Choose berries  When to Eat . Intermittent Fasting: o Choosing not to eat for a specific time period, but DO FOCUS ON HYDRATION when fasting o Multiple Techniques: - Time Restricted Eating: eat 3 meals in a day, each meal lasting no more than 60 minutes, no snacks between meals - 16-18 hour fast: fast for 16 to 18 hours up to 7 days a week. Often suggested to start with 2-3 nonconsecutive days per week.  . Remember the time you sleep is counted as fasting.  . Examples of eating schedule: Fast from 7:00pm-11:00am. Eat between 11:00am-7:00pm.  - 24-hour fast: fast for 24 hours up to every other day. Often suggested to start with 1 day per week . Remember the time you sleep is counted as fasting . Examples of eating schedule:  o Eating day: eat 2-3 meals on your eating day. If doing 2 meals, each meal should last no more than 90 minutes. If doing 3 meals, each meal should last no more than 60 minutes. Finish last meal by 7:00pm. o Fasting day: Fast until 7:00pm.  o IF YOU FEEL UNWELL FOR ANY REASON/IN ANY WAY WHEN FASTING, STOP FASTING BY EATING A NUTRITIOUS SNACK OR LIGHT MEAL o ALWAYS FOCUS ON HYDRATION DURING FASTS - Acceptable Hydration sources: water, broths, tea/coffee (black tea/coffee is best but using a small amount of whole-fat dairy products in coffee/tea is acceptable).  -   Poor Hydration Sources: anything with sugar or artificial sweeteners added to it  These recommendations have been developed for patients that are actively receiving medical care from either Dr. Sabrena Gavitt or Claire Gray, DNP, NP-C at [facility]. These recommendations are developed for patients with specific medical conditions and are not meant to be  distributed or used by others that are not actively receiving care from either provider listed above at [facility]. It is not appropriate to participate in the above eating plans without proper medical supervision.   Reference: Fung, J. The obesity code. [place]: [place]; 2016.   

## 2019-12-31 NOTE — Progress Notes (Signed)
Metrics: Intervention Frequency ACO  Documented Smoking Status Yearly  Screened one or more times in 24 months  Cessation Counseling or  Active cessation medication Past 24 months  Past 24 months   Guideline developer: UpToDate (See UpToDate for funding source) Date Released: 2014       Wellness Office Visit  Subjective:  Patient ID: THECLA FORGIONE, female    DOB: April 21, 1951  Age: 69 y.o. MRN: [id number]  CC: This lady comes in for diabetes, hypertension, morbid obesity, hyperlipidemia and sleep apnea. HPI  Unfortunately, her daughter with Huntington's disease died in 08-20-22 of this year.  She is also going through a stressful period with her son.  She has not done very well in terms of losing weight and becoming healthier. She continues on Metformin for her diabetes.  Her last hemoglobin A1c in March of this year was 7%. She continues on enalapril for hypertension.  She denies any chest pain, dyspnea, palpitations or limb weakness. She continues on statin therapy for hyperlipidemia in the face of diabetes. Past Medical History:  Diagnosis Date  . Allergy   . Anemia   . Diabetes mellitus without complication ([place])   . Hyperlipidemia   . Hypertension   . Sleep apnea    Past Surgical History:  Procedure Laterality Date  . ABDOMINAL HYSTERECTOMY     fibroids  . fatty tumor     removed     Family History  Problem Relation Age of Onset  . Alzheimer's disease Mother   . Cancer Mother        unknown  . Alcohol abuse Father   . COPD Father   . Diabetes Sister   . Hypertension Sister   . Huntington's disease Daughter     Social History   Social History Narrative   Lives with disabled mother with Alzheimer's   Daughter with Hungtington's passed away Aug 21, 2019.   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   Social History   Tobacco Use  . Smoking status: Former Smoker    Years: 2.00    Types: Cigarettes   Start date: 08/02/1971    Quit date: 08/01/1986    Years since quitting: 33.4  . Smokeless tobacco: Never Used  Substance Use Topics  . Alcohol use: No    Current Meds  Medication Sig  . aspirin 81 MG tablet Take 81 mg by mouth daily.  . blood glucose meter kit and supplies KIT Check your blood sugar daily before breakfast.  . Cholecalciferol (VITAMIN D3) 50 MCG (2000 UT) TABS Take 1 tablet by mouth daily.  . enalapril (VASOTEC) 5 MG tablet Take 1 tablet by mouth once daily  . glucose blood test strip Test bid  . Lancets MISC Test bid  . metFORMIN (GLUCOPHAGE) 500 MG tablet Take 1 tablet (500 mg total) by mouth daily with breakfast.  . Multiple Vitamin (MULTIVITAMINS PO) Take 1 tablet by mouth daily.  . Omega-3 Fatty Acids (FISH OIL) 1000 MG CAPS Take 2,000 mg by mouth 2 (two) times daily.  . simvastatin (ZOCOR) 40 MG tablet TAKE 1 TABLET BY MOUTH ONCE DAILY AT 6 PM      Depression screen [facility] 2/9 10/14/2019 08/03/2017  Decreased Interest 0 0  Down, Depressed, Hopeless 0 0  PHQ - 2 Score 0 0     Objective:   Today's Vitals: BP (!) 150/85 (BP Location: Left Arm, Patient Position: Sitting, Cuff Size: Normal)   Pulse 72   Temp 98.1  F (36.7 C) (Temporal)   Ht '5\' 6"'$  (1.676 m)   Wt 224 lb 12.8 oz (102 kg)   SpO2 96%   BMI 36.28 kg/m  Vitals with BMI 12/31/2019 10/14/2019 05/29/2019  Height '5\' 6"'$  '5\' 6"'$  '5\' 6"'$   Weight 224 lbs 13 oz 221 lbs 218 lbs  BMI 36.3 44.71 58.0  Systolic 638 685 488  Diastolic 85 78 70  Pulse 72 83 73     Physical Exam   She looks systemically well but remains morbidly obese.  She has gained about 3 pounds since March.  Her blood pressure also is not well controlled.    Assessment   1. Type 2 diabetes mellitus without complication, without long-term current use of insulin ([place])   2. Essential hypertension, benign   3. Mixed hyperlipidemia   4. Vitamin D deficiency disease   5. Morbid obesity ([place])       Tests ordered No orders of the defined  types were placed in this encounter.    Plan: 1. I stressed to the patient the importance of getting her diabetes under better control and losing weight.  We discussed nutrition again and the concept of intermittent fasting which she appears to do for 17 hours a day or so but also the importance of insulin resistance and reducing insulin levels.  I would gave her a diet sheet to look over and gave her pointers regarding what kind of foods to eat also.  She will continue with Metformin for the time being. 2. She will continue with enalapril for hypertension and I do not want to raise the dose right now unless she is not able to lose weight as we discussed. 3. She will continue with statin therapy which she seems to tolerate without myalgias.  We will check this on the next visit. 4. Her vitamin D levels are not optimal and I told her to increase the vitamin D3 to a dose of 6000 units daily.  We will check this the next time. 5. Follow-up in about 3 weeks time.   No orders of the defined types were placed in this encounter.   Doree Albee, MD

## 2020-01-15 ENCOUNTER — Other Ambulatory Visit (INDEPENDENT_AMBULATORY_CARE_PROVIDER_SITE_OTHER): Payer: Self-pay | Admitting: Internal Medicine

## 2020-01-15 NOTE — Telephone Encounter (Signed)
Refill needed

## 2020-01-20 ENCOUNTER — Ambulatory Visit (INDEPENDENT_AMBULATORY_CARE_PROVIDER_SITE_OTHER): Payer: Medicare Other | Admitting: Internal Medicine

## 2020-01-27 ENCOUNTER — Other Ambulatory Visit (INDEPENDENT_AMBULATORY_CARE_PROVIDER_SITE_OTHER): Payer: Self-pay

## 2020-01-27 MED ORDER — METFORMIN HCL 500 MG PO TABS: ORAL_TABLET | ORAL | 0 refills | Status: DC

## 2020-02-05 ENCOUNTER — Other Ambulatory Visit (INDEPENDENT_AMBULATORY_CARE_PROVIDER_SITE_OTHER): Payer: Self-pay | Admitting: Nurse Practitioner

## 2020-02-12 ENCOUNTER — Other Ambulatory Visit (INDEPENDENT_AMBULATORY_CARE_PROVIDER_SITE_OTHER): Payer: Self-pay

## 2020-02-12 MED ORDER — METFORMIN HCL 500 MG PO TABS: ORAL_TABLET | ORAL | 0 refills | Status: DC

## 2020-02-17 ENCOUNTER — Ambulatory Visit (INDEPENDENT_AMBULATORY_CARE_PROVIDER_SITE_OTHER): Payer: Medicare Other | Admitting: Internal Medicine

## 2020-02-17 ENCOUNTER — Encounter (INDEPENDENT_AMBULATORY_CARE_PROVIDER_SITE_OTHER): Payer: Self-pay | Admitting: Internal Medicine

## 2020-02-17 ENCOUNTER — Other Ambulatory Visit: Payer: Self-pay

## 2020-02-17 VITALS — BP 132/80 | HR 57 | Temp 97.9°F | Resp 18 | Ht 66.0 in | Wt 221.1 lb

## 2020-02-17 DIAGNOSIS — E782 Mixed hyperlipidemia: Secondary | ICD-10-CM

## 2020-02-17 DIAGNOSIS — E119 Type 2 diabetes mellitus without complications: Secondary | ICD-10-CM | POA: Diagnosis not present

## 2020-02-17 DIAGNOSIS — I10 Essential (primary) hypertension: Secondary | ICD-10-CM | POA: Diagnosis not present

## 2020-02-17 MED ORDER — METFORMIN HCL 500 MG PO TABS: ORAL_TABLET | ORAL | 1 refills | Status: DC

## 2020-02-17 NOTE — Progress Notes (Signed)
Metrics: Intervention Frequency ACO  Documented Smoking Status Yearly  Screened one or more times in 24 months  Cessation Counseling or  Active cessation medication Past 24 months  Past 24 months   Guideline developer: UpToDate (See UpToDate for funding source) Date Released: 18-Sep-2012       Wellness Office Visit  Subjective:  Patient ID: [name], female    DOB: 30-Nov-1950  Age: 68 y.o. MRN: [id number]  CC: This lady comes in for follow-up of hypertension, diabetes and hyperlipidemia. HPI  Since the death of her daughter and now her son was involved in a hit and run and is awaiting court case, she has been very stressed and as a result has not been very consistent with her nutrition.  She does try to do intermittent fasting when she can.  I do not know how healthy she is eating but we have discussed the healthy foods previously. She continues to take Metformin daily.  She has seen an eye doctor this year and there was no evidence of diabetic retinopathy. She continues on enalapril for hypertension and she is tolerating this well and she also continues on simvastatin for hyperlipidemia in the face of diabetes. She denies any chest pain, dyspnea, palpitations or limb weakness. Past Medical History:  Diagnosis Date  . Allergy   . Anemia   . Diabetes mellitus without complication ([place])   . Hyperlipidemia   . Hypertension   . Sleep apnea    Past Surgical History:  Procedure Laterality Date  . ABDOMINAL HYSTERECTOMY     fibroids  . fatty tumor     removed     Family History  Problem Relation Age of Onset  . Alzheimer's disease Mother   . Cancer Mother        unknown  . Alcohol abuse Father   . COPD Father   . Diabetes Sister   . Hypertension Sister   . Huntington's disease Daughter     Social History   Social History Narrative   Lives with disabled mother with Alzheimer's   Daughter with Hungtington's passed away 2019-09-19.   Separated for 15 years.Lives with  kids and mother.Retired.Previously in Comptroller.   Social History   Tobacco Use  . Smoking status: Former Smoker    Years: 2.00    Types: Cigarettes    Start date: 08/02/1971    Quit date: 08/01/1986    Years since quitting: 33.5  . Smokeless tobacco: Never Used  Substance Use Topics  . Alcohol use: No    Current Meds  Medication Sig  . aspirin 81 MG tablet Take 81 mg by mouth daily.  . blood glucose meter kit and supplies KIT Check your blood sugar daily before breakfast.  . Cholecalciferol (VITAMIN D3) 50 MCG (2000 UT) TABS Take 1 tablet by mouth daily.  . enalapril (VASOTEC) 5 MG tablet Take 1 tablet by mouth once daily  . glucose blood test strip Test bid  . Lancets MISC Test bid  . metFORMIN (GLUCOPHAGE) 500 MG tablet Take 1 tablet by mouth once daily with breakfast  . Multiple Vitamin (MULTIVITAMINS PO) Take 1 tablet by mouth daily.  . Omega-3 Fatty Acids (FISH OIL) 1000 MG CAPS Take 2,000 mg by mouth 2 (two) times daily.  . simvastatin (ZOCOR) 40 MG tablet TAKE 1 TABLET BY MOUTH ONCE DAILY AT 6 PM  . [DISCONTINUED] metFORMIN (GLUCOPHAGE) 500 MG tablet Take 1 tablet by mouth once daily with breakfast  Depression screen [facility] 2/9 10/14/2019 08/03/2017  Decreased Interest 0 0  Down, Depressed, Hopeless 0 0  PHQ - 2 Score 0 0     Objective:   Today's Vitals: BP 132/80 (BP Location: Left Arm, Patient Position: Sitting, Cuff Size: Normal)   Pulse (!) 57   Temp 97.9 F (36.6 C) (Temporal)   Resp 18   Ht 5' 6" (1.676 m)   Wt 221 lb 1.6 oz (100.3 kg)   SpO2 97%   BMI 35.69 kg/m  Vitals with BMI 02/17/2020 12/31/2019 10/14/2019  Height 5' 6" 5' 6" 5' 6"  Weight 221 lbs 2 oz 224 lbs 13 oz 221 lbs  BMI 35.7 47.0 96.28  Systolic 366 294 765  Diastolic 80 85 78  Pulse 57 72 83     Physical Exam   She looks systemically well.  Blood pressure is better controlled than it was last time.  She is alert and orientated without any focal neurological  signs.    Assessment   1. Mixed hyperlipidemia   2. Type 2 diabetes mellitus without complication, without long-term current use of insulin ([place])   3. Essential hypertension, benign       Tests ordered Orders Placed This Encounter  Procedures  . COMPLETE METABOLIC PANEL WITH GFR  . Hemoglobin A1c  . Lipid panel     Plan: 1. Blood work is ordered. 2. She will continue with simvastatin for hyperlipidemia and we will check a lipid panel today. 3. She will continue with Metformin for the diabetes and I will check an A1c today.  Hopefully it is better. 4. She will continue with enalapril for hypertension which is controlling her blood pressure well. 5. Further recommendations will depend on blood results and I will see her in 3 months time for follow-up.   Meds ordered this encounter  Medications  . metFORMIN (GLUCOPHAGE) 500 MG tablet    Sig: Take 1 tablet by mouth once daily with breakfast    Dispense:  90 tablet    Refill:  1    Delroy Ordway Luther Parody, MD

## 2020-02-18 LAB — COMPLETE METABOLIC PANEL WITH GFR
AG Ratio: 1.2 (calc) (ref 1.0–2.5)
ALT: 17 U/L (ref 6–29)
AST: 17 U/L (ref 10–35)
Albumin: 4 g/dL (ref 3.6–5.1)
Alkaline phosphatase (APISO): 49 U/L (ref 37–153)
BUN: 10 mg/dL (ref 7–25)
CO2: 30 mmol/L (ref 20–32)
Calcium: 9.9 mg/dL (ref 8.6–10.4)
Chloride: 105 mmol/L (ref 98–110)
Creat: 0.97 mg/dL (ref 0.50–0.99)
GFR, Est African American: 70 mL/min/{1.73_m2} (ref >=60)
GFR, Est Non African American: 60 mL/min/{1.73_m2} (ref >=60)
Globulin: 3.3 g/dL (calc) (ref 1.9–3.7)
Glucose, Bld: 106 mg/dL — ABNORMAL HIGH (ref 65–99)
Potassium: 4.4 mmol/L (ref 3.5–5.3)
Sodium: 141 mmol/L (ref 135–146)
Total Bilirubin: 0.4 mg/dL (ref 0.2–1.2)
Total Protein: 7.3 g/dL (ref 6.1–8.1)

## 2020-02-18 LAB — LIPID PANEL
Cholesterol: 145 mg/dL (ref <200)
HDL: 34 mg/dL — ABNORMAL LOW (ref >=50)
LDL Cholesterol (Calc): 71 mg/dL (calc)
Non-HDL Cholesterol (Calc): 111 mg/dL (calc) (ref <130)
Total CHOL/HDL Ratio: 4.3 (calc) (ref <5.0)
Triglycerides: 329 mg/dL — ABNORMAL HIGH (ref <150)

## 2020-02-18 LAB — HEMOGLOBIN A1C
Hgb A1c MFr Bld: 6.6 % of total Hgb — ABNORMAL HIGH (ref <5.7)
Mean Plasma Glucose: 143 (calc)
eAG (mmol/L): 7.9 (calc)

## 2020-02-18 NOTE — Progress Notes (Signed)
Please call the patient and let her know that her diabetes is actually improving which is great news!  Keep up the good work!Follow-up as scheduled

## 2020-02-18 NOTE — Progress Notes (Signed)
Pt was called and given lab results a level. Given the congrats on doing so well and working on Diabetes. To continue to work on diet and exercise daily and it will drop even more and possible to be much greater improvement. See her on next [place] appointment.

## 2020-02-25 ENCOUNTER — Ambulatory Visit: Payer: Self-pay

## 2020-02-25 ENCOUNTER — Encounter: Payer: Self-pay | Admitting: Internal Medicine

## 2020-02-25 ENCOUNTER — Other Ambulatory Visit: Payer: Self-pay

## 2020-03-04 ENCOUNTER — Other Ambulatory Visit (INDEPENDENT_AMBULATORY_CARE_PROVIDER_SITE_OTHER): Payer: Self-pay | Admitting: Nurse Practitioner

## 2020-03-19 ENCOUNTER — Other Ambulatory Visit: Payer: Self-pay

## 2020-03-19 ENCOUNTER — Encounter (INDEPENDENT_AMBULATORY_CARE_PROVIDER_SITE_OTHER): Payer: Self-pay | Admitting: Nurse Practitioner

## 2020-03-19 ENCOUNTER — Ambulatory Visit (INDEPENDENT_AMBULATORY_CARE_PROVIDER_SITE_OTHER): Payer: Medicare Other | Admitting: Nurse Practitioner

## 2020-03-19 VITALS — BP 130/70 | HR 77 | Temp 97.4°F | Resp 18 | Ht 60.0 in | Wt 218.4 lb

## 2020-03-19 DIAGNOSIS — T63444A Toxic effect of venom of bees, undetermined, initial encounter: Secondary | ICD-10-CM

## 2020-03-19 MED ORDER — CEPHALEXIN 500 MG PO CAPS: 500.0000 mg | ORAL_CAPSULE | Freq: Four times a day (QID) | ORAL | 0 refills | Status: DC

## 2020-03-19 MED ORDER — CLOBETASOL PROPIONATE 0.05 % EX CREA: 1.0000 "application " | TOPICAL_CREAM | Freq: Two times a day (BID) | CUTANEOUS | 0 refills | Status: DC

## 2020-03-19 NOTE — Progress Notes (Signed)
Subjective:  Patient ID: [name], female    DOB: 01/01/1951  Age: 69 y.o. MRN: [id number]  CC:  Chief Complaint  Patient presents with  . Insect Bite      HPI  This patient comes in today for an acute visit for the above.  She tells me 1 week ago she experienced multiple bee stings.  She tells me the sites are improving, but she is still experiencing some redness and itching.  She would like to be evaluated for this.  She denies any fevers or prolonged tenderness at the sites.   Past Medical History:  Diagnosis Date  . Allergy   . Anemia   . Diabetes mellitus without complication ([place])   . Hyperlipidemia   . Hypertension   . Sleep apnea       Family History  Problem Relation Age of Onset  . Alzheimer's disease Mother   . Cancer Mother        unknown  . Alcohol abuse Father   . COPD Father   . Diabetes Sister   . Hypertension Sister   . Huntington's disease Daughter     Social History   Social History Narrative   Lives with disabled mother with Alzheimer's   Daughter with Hungtington's passed away 08-29-19.   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   Social History   Tobacco Use  . Smoking status: Former Smoker    Years: 2.00    Types: Cigarettes    Start date: 08/02/1971    Quit date: 08/01/1986    Years since quitting: 33.6  . Smokeless tobacco: Never Used  Substance Use Topics  . Alcohol use: No     Current Meds  Medication Sig  . aspirin 81 MG tablet Take 81 mg by mouth daily.  . blood glucose meter kit and supplies KIT Check your blood sugar daily before breakfast.  . Cholecalciferol (VITAMIN D3) 50 MCG (2000 UT) TABS Take 1 tablet by mouth daily.  . enalapril (VASOTEC) 5 MG tablet Take 1 tablet by mouth once daily  . glucose blood test strip Test bid  . Lancets MISC Test bid  . metFORMIN (GLUCOPHAGE) 500 MG tablet Take 1 tablet by mouth once daily with breakfast  . Multiple  Vitamin (MULTIVITAMINS PO) Take 1 tablet by mouth daily.  . Omega-3 Fatty Acids (FISH OIL) 1000 MG CAPS Take 2,000 mg by mouth 2 (two) times daily.  . simvastatin (ZOCOR) 40 MG tablet TAKE 1 TABLET BY MOUTH ONCE DAILY AT 6 PM    ROS:  See HPI   Objective:   Today's Vitals: BP 130/70 (BP Location: Right Arm, Patient Position: Sitting, Cuff Size: Normal)   Pulse 77   Temp (!) 97.4 F (36.3 C) (Temporal)   Resp 18   Ht 5' (1.524 m)   Wt 218 lb 6.4 oz (99.1 kg)   SpO2 98%   BMI 42.65 kg/m  Vitals with BMI 03/19/2020 02/17/2020 12/31/2019  Height $Remov'5\' 0"'zbvXPU$  $Remove'5\' 6"'UOjMhIp$  $RemoveB'5\' 6"'PeRGegoV$   Weight 218 lbs 6 oz 221 lbs 2 oz 224 lbs 13 oz  BMI 42.65 62.3 76.2  Systolic 831 517 616  Diastolic 70 80 85  Pulse 77 57 72     Physical Exam Vitals reviewed.  Constitutional:      Appearance: Normal appearance.  HENT:     Head: Normocephalic and atraumatic.  Skin:    General: Skin is warm and dry.  Neurological:     General: No focal deficit present.     Mental Status: She is alert and oriented to person, place, and time.          Assessment and Plan   1. Bee sting, undetermined intent, initial encounter      Plan: 1.  Suspicion for cellulitis is fairly low, however she does have some redness to bee sting site that was on her left leg.  I have recommended that she monitor the site for the next 3 days and if she notices any pain, worsening redness, or worsening swelling that she should start taking Keflex by mouth for treatment of cellulitis.  In the meantime I will prescribe a steroid cream that she will apply to the area twice a day as needed for up to 2 weeks.   Tests ordered No orders of the defined types were placed in this encounter.     Meds ordered this encounter  Medications  . cephALEXin (KEFLEX) 500 MG capsule    Sig: Take 1 capsule (500 mg total) by mouth 4 (four) times daily.    Dispense:  40 capsule    Refill:  0    Order Specific Question:   Supervising Provider     Answer:   Hurshel Party C [[id number]]  . clobetasol cream (TEMOVATE) 0.05 %    Sig: Apply 1 application topically 2 (two) times daily.    Dispense:  30 g    Refill:  0    Order Specific Question:   Supervising Provider    Answer:   Doree Albee [[id number]]    Patient to follow-up in 1 month as scheduled or sooner as needed.  Ailene Ards, NP

## 2020-03-19 NOTE — Patient Instructions (Signed)
Only take the antibiotic if the site starts to get more red, painful, or starts to swell more.  If you have to take antibiotic take 1 tablet by mouth every 6 hours for a total of 10 days.  I am also prescribing a steroid cream called clobetasol.  You will apply this to the irritated areas on her skin twice a day as needed for up to 2 weeks then you must stop applying cream.

## 2020-05-06 ENCOUNTER — Other Ambulatory Visit (INDEPENDENT_AMBULATORY_CARE_PROVIDER_SITE_OTHER): Payer: Self-pay | Admitting: Internal Medicine

## 2020-05-19 ENCOUNTER — Ambulatory Visit (INDEPENDENT_AMBULATORY_CARE_PROVIDER_SITE_OTHER): Payer: Medicare Other | Admitting: Internal Medicine

## 2020-06-04 ENCOUNTER — Other Ambulatory Visit (INDEPENDENT_AMBULATORY_CARE_PROVIDER_SITE_OTHER): Payer: Self-pay | Admitting: Nurse Practitioner

## 2020-06-10 ENCOUNTER — Other Ambulatory Visit: Payer: Self-pay

## 2020-06-10 ENCOUNTER — Encounter (INDEPENDENT_AMBULATORY_CARE_PROVIDER_SITE_OTHER): Payer: Self-pay | Admitting: Internal Medicine

## 2020-06-10 ENCOUNTER — Ambulatory Visit (INDEPENDENT_AMBULATORY_CARE_PROVIDER_SITE_OTHER): Payer: Medicare Other | Admitting: Internal Medicine

## 2020-06-10 VITALS — BP 126/76 | HR 76 | Temp 97.3°F | Ht 60.0 in | Wt 222.8 lb

## 2020-06-10 DIAGNOSIS — I10 Essential (primary) hypertension: Secondary | ICD-10-CM | POA: Diagnosis not present

## 2020-06-10 DIAGNOSIS — Z23 Encounter for immunization: Secondary | ICD-10-CM

## 2020-06-10 DIAGNOSIS — E119 Type 2 diabetes mellitus without complications: Secondary | ICD-10-CM | POA: Diagnosis not present

## 2020-06-10 DIAGNOSIS — E782 Mixed hyperlipidemia: Secondary | ICD-10-CM

## 2020-06-10 NOTE — Addendum Note (Signed)
Addended by: Anibal Henderson on: 06/10/2020 04:23 PM   Modules accepted: Orders

## 2020-06-10 NOTE — Progress Notes (Signed)
Metrics: Intervention Frequency ACO  Documented Smoking Status Yearly  Screened one or more times in 24 months  Cessation Counseling or  Active cessation medication Past 24 months  Past 24 months   Guideline developer: UpToDate (See UpToDate for funding source) Date Released: 2014       Wellness Office Visit  Subjective:  Patient ID: [name], female    DOB: [birth year]  Age: 69 y.o. MRN: [id number]  CC: This lady comes in for follow-up of diabetes, hypertension and hyperlipidemia as well as morbid obesity. HPI  She has no complaints today.  She has not checked her blood sugars on a regular basis.  Her last hemoglobin A1c actually showed an improvement. She continues with statin therapy in the face of diabetes. She continues with Metformin for diabetes. She continues with enalapril for hypertension and she is tolerating this quite well. She denies any chest pain, dyspnea, palpitations or limb weakness. Past Medical History:  Diagnosis Date  . Allergy   . Anemia   . Diabetes mellitus without complication ([place])   . Hyperlipidemia   . Hypertension   . Sleep apnea    Past Surgical History:  Procedure Laterality Date  . ABDOMINAL HYSTERECTOMY     fibroids  . fatty tumor     removed     Family History  Problem Relation Age of Onset  . Alzheimer's disease Mother   . Cancer Mother        unknown  . Alcohol abuse Father   . COPD Father   . Diabetes Sister   . Hypertension Sister   . Huntington's disease Daughter     Social History   Social History Narrative   Lives with disabled mother with Alzheimer's   Daughter with Hungtington's passed away 08/18/2019.   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   Social History   Tobacco Use  . Smoking status: Former Smoker    Years: 2.00    Types: Cigarettes    Start date: 08/02/1971    Quit date: 08/01/1986    Years since quitting: 33.8  . Smokeless tobacco: Never  Used  Substance Use Topics  . Alcohol use: No    Current Meds  Medication Sig  . aspirin 81 MG tablet Take 81 mg by mouth daily.  . blood glucose meter kit and supplies KIT Check your blood sugar daily before breakfast.  . Cholecalciferol (VITAMIN D3) 50 MCG (2000 UT) TABS Take 1 tablet by mouth daily.  . enalapril (VASOTEC) 5 MG tablet Take 1 tablet by mouth once daily  . glucose blood test strip Test bid  . Lancets MISC Test bid  . metFORMIN (GLUCOPHAGE) 500 MG tablet Take 1 tablet by mouth once daily with breakfast  . Multiple Vitamin (MULTIVITAMINS PO) Take 1 tablet by mouth daily.  . Omega-3 Fatty Acids (FISH OIL) 1000 MG CAPS Take 2,000 mg by mouth 2 (two) times daily.  . simvastatin (ZOCOR) 40 MG tablet TAKE 1 TABLET BY MOUTH ONCE DAILY AT 6 PM  . [DISCONTINUED] cephALEXin (KEFLEX) 500 MG capsule Take 1 capsule (500 mg total) by mouth 4 (four) times daily.  . [DISCONTINUED] clobetasol cream (TEMOVATE) 2.95 % Apply 1 application topically 2 (two) times daily.      Depression screen [facility] 2/9 10/14/2019 08/03/2017  Decreased Interest 0 0  Down, Depressed, Hopeless 0 0  PHQ - 2 Score 0 0     Objective:   Today's Vitals: BP 126/76  Pulse 76   Temp (!) 97.3 F (36.3 C) (Temporal)   Ht 5' (1.524 m)   Wt 222 lb 12.8 oz (101.1 kg)   SpO2 96%   BMI 43.51 kg/m  Vitals with BMI 06/10/2020 03/19/2020 02/17/2020  Height $Remov'5\' 0"'oUjlzN$  $Remove'5\' 0"'xyWRXNf$  $RemoveB'5\' 6"'sIcWqfcW$   Weight 222 lbs 13 oz 218 lbs 6 oz 221 lbs 2 oz  BMI 43.51 01.41 03.0  Systolic 131 438 887  Diastolic 76 70 80  Pulse 76 77 57     Physical Exam   She remains morbidly obese.  Blood pressure is in a very good range.  No other new physical findings today.    Assessment   1. Type 2 diabetes mellitus without complication, without long-term current use of insulin ([place])   2. Essential hypertension, benign   3. Mixed hyperlipidemia   4. Morbid obesity ([place])       Tests ordered Orders Placed This Encounter  Procedures  . COMPLETE  METABOLIC PANEL WITH GFR  . Hemoglobin A1c     Plan: 1. She will continue with Metformin and we will check an A1c today. 2. She will continue with ACE inhibitor and we will check electrolytes and renal function today. 3. She will continue with statin therapy as before and she is tolerating this. 4. High-dose influenza vaccination was given today. 5. Follow-up with Judson Roch as previously scheduled in March of next year for an annual Medicare wellness visit.   No orders of the defined types were placed in this encounter.   Doree Albee, MD

## 2020-06-11 ENCOUNTER — Ambulatory Visit: Payer: Medicare Other | Attending: Internal Medicine

## 2020-06-11 DIAGNOSIS — Z23 Encounter for immunization: Secondary | ICD-10-CM

## 2020-06-11 LAB — HEMOGLOBIN A1C
Hgb A1c MFr Bld: 6.7 % of total Hgb — ABNORMAL HIGH (ref <5.7)
Mean Plasma Glucose: 146 (calc)
eAG (mmol/L): 8.1 (calc)

## 2020-06-11 LAB — COMPLETE METABOLIC PANEL WITH GFR
AG Ratio: 1.3 (calc) (ref 1.0–2.5)
ALT: 19 U/L (ref 6–29)
AST: 20 U/L (ref 10–35)
Albumin: 4.1 g/dL (ref 3.6–5.1)
Alkaline phosphatase (APISO): 49 U/L (ref 37–153)
BUN: 11 mg/dL (ref 7–25)
CO2: 33 mmol/L — ABNORMAL HIGH (ref 20–32)
Calcium: 9.9 mg/dL (ref 8.6–10.4)
Chloride: 106 mmol/L (ref 98–110)
Creat: 0.94 mg/dL (ref 0.50–0.99)
GFR, Est African American: 72 mL/min/{1.73_m2} (ref >=60)
GFR, Est Non African American: 62 mL/min/{1.73_m2} (ref >=60)
Globulin: 3.1 g/dL (calc) (ref 1.9–3.7)
Glucose, Bld: 81 mg/dL (ref 65–99)
Potassium: 4.7 mmol/L (ref 3.5–5.3)
Sodium: 143 mmol/L (ref 135–146)
Total Bilirubin: 0.3 mg/dL (ref 0.2–1.2)
Total Protein: 7.2 g/dL (ref 6.1–8.1)

## 2020-06-11 NOTE — Progress Notes (Signed)
   Covid-19 Vaccination [facility]  Name:  [name]    MRN: [id number] DOB: 1951/03/29  06/11/2020  [name] was observed post Covid-19 immunization for 15 minutes without incident. She was provided with Vaccine Information Sheet and instruction to access the V-Safe system.   [name] was instructed to call 911 with any severe reactions post vaccine: Marland Kitchen Difficulty breathing  . Swelling of face and throat  . A fast heartbeat  . A bad rash all over body  . Dizziness and weakness

## 2020-06-15 NOTE — Progress Notes (Signed)
Please let the patient know that her blood tests are stable.  Continue to work on nutrition as before.Follow-up as scheduled.

## 2020-08-14 ENCOUNTER — Other Ambulatory Visit (INDEPENDENT_AMBULATORY_CARE_PROVIDER_SITE_OTHER): Payer: Self-pay | Admitting: Internal Medicine

## 2020-08-15 ENCOUNTER — Other Ambulatory Visit (INDEPENDENT_AMBULATORY_CARE_PROVIDER_SITE_OTHER): Payer: Self-pay | Admitting: Internal Medicine

## 2020-08-25 ENCOUNTER — Other Ambulatory Visit (INDEPENDENT_AMBULATORY_CARE_PROVIDER_SITE_OTHER): Payer: Self-pay | Admitting: Internal Medicine

## 2020-09-08 ENCOUNTER — Other Ambulatory Visit (INDEPENDENT_AMBULATORY_CARE_PROVIDER_SITE_OTHER): Payer: Self-pay | Admitting: Internal Medicine

## 2020-10-19 ENCOUNTER — Ambulatory Visit (INDEPENDENT_AMBULATORY_CARE_PROVIDER_SITE_OTHER): Payer: Medicare Other | Admitting: Nurse Practitioner

## 2020-10-26 ENCOUNTER — Encounter (INDEPENDENT_AMBULATORY_CARE_PROVIDER_SITE_OTHER): Payer: Medicare Other | Admitting: Nurse Practitioner

## 2020-11-05 ENCOUNTER — Other Ambulatory Visit: Payer: Self-pay

## 2020-11-05 ENCOUNTER — Telehealth (INDEPENDENT_AMBULATORY_CARE_PROVIDER_SITE_OTHER): Payer: Self-pay | Admitting: Nurse Practitioner

## 2020-11-05 ENCOUNTER — Encounter (INDEPENDENT_AMBULATORY_CARE_PROVIDER_SITE_OTHER): Payer: Self-pay | Admitting: Nurse Practitioner

## 2020-11-05 ENCOUNTER — Ambulatory Visit (INDEPENDENT_AMBULATORY_CARE_PROVIDER_SITE_OTHER): Payer: Medicare Other | Admitting: Nurse Practitioner

## 2020-11-05 VITALS — BP 138/78 | HR 84 | Temp 97.5°F | Ht 67.0 in | Wt 219.6 lb

## 2020-11-05 DIAGNOSIS — Z599 Problem related to housing and economic circumstances, unspecified: Secondary | ICD-10-CM

## 2020-11-05 DIAGNOSIS — J3489 Other specified disorders of nose and nasal sinuses: Secondary | ICD-10-CM

## 2020-11-05 DIAGNOSIS — R079 Chest pain, unspecified: Secondary | ICD-10-CM | POA: Diagnosis not present

## 2020-11-05 DIAGNOSIS — Z0000 Encounter for general adult medical examination without abnormal findings: Secondary | ICD-10-CM

## 2020-11-05 DIAGNOSIS — Z1231 Encounter for screening mammogram for malignant neoplasm of breast: Secondary | ICD-10-CM

## 2020-11-05 DIAGNOSIS — Z0001 Encounter for general adult medical examination with abnormal findings: Secondary | ICD-10-CM

## 2020-11-05 DIAGNOSIS — D229 Melanocytic nevi, unspecified: Secondary | ICD-10-CM | POA: Diagnosis not present

## 2020-11-05 MED ORDER — MUPIROCIN CALCIUM 2 % NA OINT: 1.0000 "application " | TOPICAL_OINTMENT | Freq: Two times a day (BID) | NASAL | 0 refills | Status: DC

## 2020-11-05 NOTE — Progress Notes (Signed)
Subjective:   [name] is a 70 y.o. female who presents for Medicare Annual (Subsequent) preventive examination.  Review of Systems     Cardiac Risk Factors include: advanced age (>70mn, >>1women);diabetes mellitus;hypertension;obesity (BMI >30kg/m2)     Objective:    Today's Vitals   11/05/20 1310  BP: 138/78  Pulse: 84  Temp: (!) 97.5 F (36.4 C)  TempSrc: Temporal  SpO2: 96%  Weight: 219 lb 9.6 oz (99.6 kg)  Height: _0  (1.702 m)   Body mass index is 34.39 kg/m.  Advanced Directives 11/05/2020 10/14/2019  Does Patient Have a Medical Advance Directive? No No  Would patient like information on creating a medical advance directive? No - Patient declined No - Patient declined    Current Medications (verified) Outpatient Encounter Medications as of 11/05/2020  Medication Sig  . aspirin 81 MG tablet Take 81 mg by mouth daily.  . blood glucose meter kit and supplies KIT Check your blood sugar daily before breakfast.  . chlorpheniramine (RA CHLORPHENIRAMINE MALEATE) 4 MG tablet Take 4 mg by mouth daily.  . Cholecalciferol (VITAMIN D3) 50 MCG (2000 UT) TABS Take 1 tablet by mouth daily.  . enalapril (VASOTEC) 5 MG tablet Take 1 tablet by mouth once daily  . glucose blood test strip Test bid  . Lancets MISC Test bid  . metFORMIN (GLUCOPHAGE) 500 MG tablet Take 1 tablet by mouth once daily with breakfast  . Multiple Vitamin (MULTIVITAMINS PO) Take 1 tablet by mouth daily.  . Omega-3 Fatty Acids (FISH OIL) 1000 MG CAPS Take 2,000 mg by mouth 2 (two) times daily.  . simvastatin (ZOCOR) 40 MG tablet TAKE 1 TABLET BY MOUTH ONCE DAILY AT 6 PM   No facility-administered encounter medications on file as of 11/05/2020.    Allergies (verified) Sulfonamide derivatives   History: Past Medical History:  Diagnosis Date  . Allergy   . Anemia   . Diabetes mellitus without complication ([place]   . Hyperlipidemia   . Hypertension   . Sleep apnea    Past Surgical History:   Procedure Laterality Date  . ABDOMINAL HYSTERECTOMY     fibroids  . fatty tumor     removed   Family History  Problem Relation Age of Onset  . Alzheimer's disease Mother   . Cancer Mother        unknown  . Alcohol abuse Father   . COPD Father   . Diabetes Sister   . Hypertension Sister   . Huntington's disease Daughter    Social History   Socioeconomic History  . Marital status: Divorced    Spouse name: Not on file  . Number of children: 2  . Years of education: Not on file  . Highest education level: 11th grade  Occupational History  . Occupation: unemployed    Comment: caregiver  Tobacco Use  . Smoking status: Former Smoker    Years: 2.00    Types: Cigarettes    Start date: 08/02/1971    Quit date: 08/01/1986    Years since quitting: 34.2  . Smokeless tobacco: Never Used  Vaping Use  . Vaping Use: Never used  Substance and Sexual Activity  . Alcohol use: No  . Drug use: No  . Sexual activity: Never    Birth control/protection: Surgical  Other Topics Concern  . Not on file  Social History Narrative   Lives with disabled mother with Alzheimer's   Daughter with [place]'s passed away J02/02/21   Separated  for 15 years.Lives with kids and mother.Retired.Previously in Comptroller.   Social Determinants of Health   Financial Resource Strain: Not on file  Food Insecurity: Not on file  Transportation Needs: Not on file  Physical Activity: Not on file  Stress: Not on file  Social Connections: Not on file    Tobacco Counseling Counseling given: Yes   Clinical Intake:  Pre-visit preparation completed: Yes  Pain : No/denies pain     BMI - recorded: 34.39 Nutritional Status: BMI > 30  Obese Nutritional Risks: None Diabetes: Yes CBG done?: No Did pt. bring in CBG monitor from home?: No  How often do you need to have someone help you when you read instructions, pamphlets, or other written materials from your doctor or pharmacy?: 1 -  Never What is the last grade level you completed in school?: 11th grade  Diabetic? yes  Interpreter Needed?: No  Information entered by :: Jeralyn Ruths, NP-C   Activities of Daily Living In your present state of health, do you have any difficulty performing the following activities: 11/05/2020  Hearing? N  Vision? Y  Difficulty concentrating or making decisions? N  Walking or climbing stairs? N  Dressing or bathing? N  Doing errands, shopping? N  Preparing Food and eating ? N  Using the Toilet? N  In the past six months, have you accidently leaked urine? N  Do you have problems with loss of bowel control? N  Managing your Medications? N  Managing your Finances? N  Housekeeping or managing your Housekeeping? N  Some recent data might be hidden    Patient Care Team: Doree Albee, MD as PCP - General (Internal Medicine) Gala Romney, Cristopher Estimable, MD as Consulting Physician (Gastroenterology)  Indicate any recent Medical Services you may have received from other than [facility] providers in the past year (date may be approximate).     Assessment:   This is a routine wellness examination for [name].  Hearing/Vision screen No exam data present  Dietary issues and exercise activities discussed: Current Exercise Habits: The patient does not participate in regular exercise at present, Exercise limited by: None identified  Goals    . HEMOGLOBIN A1C < 7.0    . Weight (lb) < 200 lb (90.7 kg)      Depression Screen PHQ 2/9 Scores 11/05/2020 10/14/2019 08/03/2017  PHQ - 2 Score 0 0 0  PHQ- 9 Score 0 - -  Exception Documentation - Medical reason -    Fall Risk Fall Risk  11/05/2020 10/14/2019 08/03/2017  Falls in the past year? 0 0 No  Number falls in past yr: - 0 -  Injury with Fall? - 0 -  Risk for fall due to : - No Fall Risks -  Follow up - Falls evaluation completed -    FALL RISK PREVENTION PERTAINING TO THE HOME:  Any stairs in or around the home? No  If so, are there any without  handrails? N/A Home free of loose throw rugs in walkways, pet beds, electrical cords, etc? Yes  Adequate lighting in your home to reduce risk of falls? Yes   ASSISTIVE DEVICES UTILIZED TO PREVENT FALLS:  Life alert? No  Use of a cane, walker or w/c? No  Grab bars in the bathroom? No  Shower chair or bench in shower? No  Elevated toilet seat or a handicapped toilet? No   TIMED UP AND GO:  Was the test performed? Yes .  Length of time to ambulate 10 feet:  10 sec.   Gait steady and fast without use of assistive device  Cognitive Function:     6CIT Screen 11/05/2020 10/14/2019  What Year? 0 points 0 points  What month? 3 points 0 points  What time? 0 points 0 points  Count back from 20 0 points 0 points  Months in reverse 0 points 2 points  Repeat phrase 2 points 0 points  Total Score 5 2    Immunizations Immunization History  Administered Date(s) Administered  . Fluad Quad(high Dose 65+) 05/29/2019, 06/10/2020  . Influenza Split 05/05/2015, 05/03/2016  . Influenza Whole 05/21/2007  . Influenza-Unspecified 05/02/2017  . PFIZER(Purple Top)SARS-COV-2 Vaccination 10/03/2019, 10/30/2019, 06/11/2020  . Pneumococcal Conjugate-13 08/03/2017  . Pneumococcal Polysaccharide-23 09/08/2008  . Tdap 05/29/2019    TDAP status: Up to date  Flu Vaccine status: Up to date  Pneumococcal vaccine status: Declined,  Education has been provided regarding the importance of this vaccine but patient still declined. Advised may receive this vaccine at local pharmacy or Health Dept. Aware to provide a copy of the vaccination record if obtained from local pharmacy or Health Dept. Verbalized acceptance and understanding.  - patient believes she may have had a PPSV23 within the recent past. She will check with previous provider about immunization history.   Covid-19 vaccine status: Completed vaccines - Patient may be due for booster. She will consult with her COVID immunization card to verify Qualifies  for Shingles Vaccine? Yes   Zostavax completed No   Shingrix Completed?: No.    Education has been provided regarding the importance of this vaccine. Patient has been advised to call insurance company to determine out of pocket expense if they have not yet received this vaccine. Advised may also receive vaccine at local pharmacy or Health Dept. Verbalized acceptance and understanding.  Screening Tests Health Maintenance  Topic Date Due  . COLONOSCOPY (Pts 45-2yr Insurance coverage will need to be confirmed)  Never done  . FOOT EXAM  08/03/2018  . DEXA SCAN  08/23/2020  . OPHTHALMOLOGY EXAM  09/19/2020  . MAMMOGRAM  09/26/2020  . HEMOGLOBIN A1C  12/08/2020  . INFLUENZA VACCINE  03/01/2021  . TETANUS/TDAP  05/28/2029  . COVID-19 Vaccine  Completed  . Hepatitis C Screening  Completed  . PNA vac Low Risk Adult  Completed  . HPV VACCINES  Aged Out    Health Maintenance  Health Maintenance Due  Topic Date Due  . COLONOSCOPY (Pts 45-428yrInsurance coverage will need to be confirmed)  Never done  . FOOT EXAM  08/03/2018  . DEXA SCAN  08/23/2020  . OPHTHALMOLOGY EXAM  09/19/2020  . MAMMOGRAM  09/26/2020    Colorectal cancer screening: Type of screening: Colonoscopy. Completed N/A. Repeat every N/A years - Patient will think about whether she wants to undergo colon cancer screening, and if so whether she would prefer colonoscopy or cologuard.  Mammogram status: Ordered today. Pt provided with contact info and advised to call to schedule appt.   Patient would prefer to wait on doing DEXA scan  Lung Cancer Screening: (Low Dose CT Chest recommended if Age 69-80ears, 30 pack-year currently smoking OR have quit w/in 15years.) does not qualify.   Lung Cancer Screening Referral: N/A  Additional Screening:  Hepatitis C Screening: does not qualify; Completed 08/2017  Vision Screening: Recommended annual ophthalmology exams for early detection of glaucoma and other disorders of the  eye. Is the patient up to date with their annual eye exam?  No  Who is the  provider or what is the name of the office in which the patient attends annual eye exams? Dr. Eulas Post If pt is not established with a provider, would they like to be referred to a provider to establish care? No .   Dental Screening: Recommended annual dental exams for proper oral hygiene  Community Resource Referral / Chronic Care Management: CRR required this visit?  No   CCM required this visit?  No      Plan:    1. - 3.   She is hesitant regarding whether or not she would like to undergo colon cancer screening but we did discuss colonoscopy versus Cologuard and she will consider this for the future.  She is willing to have mammogram completed so we will order this today.  She does not want to have DEXA scan at this time.  She is not sure if she is had the PPSV23 booster or not, she will look at her previous providers immunization history and let us know she is due for this.  She is due for second COVID-19 booster and will consider getting this as well.  She has not had shingles vaccine but will discuss this with her pharmacist at next time she visits to consider getting this administered.  She did mention she has a hard time sometimes affording her needs and I will refer her to social services to see if there are any services available to help with this. 4.  I believe the sore in her nose is related to irritation from having her upper respiratory infection a runny nose.  I encouraged her to continue using Vaseline as needed and also consider using Pearson ointment for prevention of bacterial infection. 5.  I believe the mole she is concerned about is a seborrheic keratosis which is a benign finding, however she is very concerned about this and she would like to be referred to dermatology for further evaluation.  Referral will be made today. 6.  She did mention some chest discomfort EKG shows sinus rhythm with signs of old  anteroseptal infarct, we will refer her to cardiology for further assistance with managing.  I have personally reviewed and noted the following in the patient's chart:   . Medical and social history . Use of alcohol, tobacco or illicit drugs  . Current medications and supplements . Functional ability and status . Nutritional status . Physical activity . Advanced directives . List of other physicians . Hospitalizations, surgeries, and ER visits in previous 12 months . Vitals . Screenings to include cognitive, depression, and falls . Referrals and appointments  In addition, I have reviewed and discussed with patient certain preventive protocols, quality metrics, and best practice recommendations. A written personalized care plan for preventive services as well as general preventive health recommendations were provided to patient.     Ailene Ards, NP   11/05/2020

## 2020-11-05 NOTE — Telephone Encounter (Signed)
Screening mammogram ordered today.

## 2020-11-05 NOTE — Progress Notes (Signed)
Subjective:  Patient ID: [name], female    DOB: [birth year]  Age: 70 y.o. MRN: [id number]  CC:  Chief Complaint  Patient presents with  . Medicare Wellness    Doing okay, has had some allergy/sinus issues  . Chest Pain  . Nevus  . Other    Sore in nose      HPI  This patient arrives today for the above.  Chest pain: She tells me she has been experiencing intermittent chest discomfort over the last couple of months.  She tells me it happens "a lot", she is not able to characterize how frequently it happens any more specifically than not.  She also is having a hard time describing its intensity or type of pain/discomfort that she is experiencing.  She tells me she does not know of any triggers that cause the pain and she tells me that it will eventually subside on its own but is unable to tell me about how long the pain lasts for.  Mole: She has a mole on her abdomen that she would like to be evaluated today.  She tells me that is been present for quite some time and is starting to itch.  Sore nose: She is recovering from upper respiratory infection and tells me that she has a sore in her nose would like this to be evaluated as well.  Past Medical History:  Diagnosis Date  . Allergy   . Anemia   . Diabetes mellitus without complication ([place])   . Hyperlipidemia   . Hypertension   . Sleep apnea       Family History  Problem Relation Age of Onset  . Alzheimer's disease Mother   . Cancer Mother        unknown  . Alcohol abuse Father   . COPD Father   . Diabetes Sister   . Hypertension Sister   . Huntington's disease Daughter     Social History   Social History Narrative   Lives with disabled mother with Alzheimer's   Daughter with [place]'s passed away Aug 14, 2019.   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   Social History   Tobacco Use  . Smoking status: Former Smoker    Years: 2.00     Types: Cigarettes    Start date: 08/02/1971    Quit date: 08/01/1986    Years since quitting: 34.2  . Smokeless tobacco: Never Used  Substance Use Topics  . Alcohol use: No     Current Meds  Medication Sig  . aspirin 81 MG tablet Take 81 mg by mouth daily.  . blood glucose meter kit and supplies KIT Check your blood sugar daily before breakfast.  . chlorpheniramine (RA CHLORPHENIRAMINE MALEATE) 4 MG tablet Take 4 mg by mouth daily.  . Cholecalciferol (VITAMIN D3) 50 MCG (2000 UT) TABS Take 1 tablet by mouth daily.  . enalapril (VASOTEC) 5 MG tablet Take 1 tablet by mouth once daily  . glucose blood test strip Test bid  . Lancets MISC Test bid  . metFORMIN (GLUCOPHAGE) 500 MG tablet Take 1 tablet by mouth once daily with breakfast  . Multiple Vitamin (MULTIVITAMINS PO) Take 1 tablet by mouth daily.  . mupirocin nasal ointment (BACTROBAN) 2 % Place 1 application into the nose 2 (two) times daily. Use one-half of tube in each nostril twice daily for five (5) days. After application, press sides of nose together and gently massage.  . Omega-3  Fatty Acids (FISH OIL) 1000 MG CAPS Take 2,000 mg by mouth 2 (two) times daily.  . simvastatin (ZOCOR) 40 MG tablet TAKE 1 TABLET BY MOUTH ONCE DAILY AT 6 PM    ROS:  See HPI   Objective:   Today's Vitals: BP 138/78   Pulse 84   Temp (!) 97.5 F (36.4 C) (Temporal)   Ht 5\' 7"  (1.702 m)   Wt 219 lb 9.6 oz (99.6 kg)   SpO2 96%   BMI 34.39 kg/m  Vitals with BMI 11/05/2020 06/10/2020 03/19/2020  Height 5\' 7"  5\' 0"  5\' 0"   Weight 219 lbs 10 oz 222 lbs 13 oz 218 lbs 6 oz  BMI 34.39 26.37 85.88  Systolic 502 774 128  Diastolic 78 76 70  Pulse 84 76 77     Physical Exam Vitals reviewed.  Constitutional:      General: She is not in acute distress.    Appearance: Normal appearance.  HENT:     Head: Normocephalic and atraumatic.     Nose: Nasal tenderness present.     Comments: Mild redness and scabbing noted internally in bilateral  nares Neck:     Vascular: No carotid bruit.  Cardiovascular:     Rate and Rhythm: Normal rate and regular rhythm.     Pulses: Normal pulses.     Heart sounds: Normal heart sounds.  Pulmonary:     Effort: Pulmonary effort is normal.     Breath sounds: Normal breath sounds.  Skin:    General: Skin is warm and dry.       Neurological:     General: No focal deficit present.     Mental Status: She is alert and oriented to person, place, and time.  Psychiatric:        Mood and Affect: Mood normal.        Behavior: Behavior normal.        Judgment: Judgment normal.          Assessment and Plan   1. Encounter for Medicare annual wellness exam   2. Need for financial support   3. Encounter for screening mammogram for malignant neoplasm of breast   4. Sore in nose   5. Enlarged skin mole   6. Chest pain, unspecified type      Plan: 1. - 3.   She is hesitant regarding whether or not she would like to undergo colon cancer screening but we did discuss colonoscopy versus Cologuard and she will consider this for the future.  She is willing to have mammogram completed so we will order this today.  She does not want to have DEXA scan at this time.  She is not sure if she is had the PPSV23 booster or not, she will look at her previous providers immunization history and let us know she is due for this.  She is due for second COVID-19 booster and will consider getting this as well.  She has not had shingles vaccine but will discuss this with her pharmacist at next time she visits to consider getting this administered.  She did mention she has a hard time sometimes affording her needs and I will refer her to social services to see if there are any services available to help with this. 4.  I believe the sore in her nose is related to irritation from having her upper respiratory infection a runny nose.  I encouraged her to continue using Vaseline as needed and also consider using Pearson  ointment  for prevention of bacterial infection. 5.  I believe the mole she is concerned about is a seborrheic keratosis which is a benign finding, however she is very concerned about this and she would like to be referred to dermatology for further evaluation.  Referral will be made today. 6.  She did mention some chest discomfort EKG shows sinus rhythm with signs of old anteroseptal infarct, we will refer her to cardiology for further assistance with managing.  She was told if she experiences any significant episodes of chest pain between now and when she can see her cardiologist she should proceed to the emergency department for emergent evaluation.  She expresses her understanding.   Tests ordered Orders Placed This Encounter  Procedures  . MM Digital Screening  . ABO AND RH   . Ambulatory referral to Social Work  . Ambulatory referral to Dermatology  . Ambulatory referral to Cardiology  . EKG 12-Lead      Meds ordered this encounter  Medications  . mupirocin nasal ointment (BACTROBAN) 2 %    Sig: Place 1 application into the nose 2 (two) times daily. Use one-half of tube in each nostril twice daily for five (5) days. After application, press sides of nose together and gently massage.    Dispense:  10 g    Refill:  0    Order Specific Question:   Supervising Provider    Answer:   Doree Albee [[id number]]    Patient to follow-up in 3 months or sooner as needed.  Ailene Ards, NP

## 2020-11-05 NOTE — Patient Instructions (Signed)
  [name] , Thank you for taking time to come for your Medicare Wellness Visit. I appreciate your ongoing commitment to your health goals. Please review the following plan we discussed and let me know if I can assist you in the future.   These are the goals we discussed: Goals    . HEMOGLOBIN A1C < 7.0    . Weight (lb) < 200 lb (90.7 kg)       This is a list of the screening recommended for you and due dates:  Health Maintenance  Topic Date Due  . Complete foot exam   08/03/2018  . Eye exam for diabetics  09/19/2020  . Mammogram  09/26/2020  . DEXA scan (bone density measurement)  11/05/2021*  . Colon Cancer Screening  11/05/2021*  . Hemoglobin A1C  12/08/2020  . Flu Shot  03/01/2021  . Tetanus Vaccine  05/28/2029  . COVID-19 Vaccine  Completed  .  Hepatitis C: One time screening is recommended by Center for Disease Control  (CDC) for  adults born from [birth year] through 1964.   Completed  . Pneumonia vaccines  Completed  . HPV Vaccine  Aged Out  *Topic was postponed. The date shown is not the original due date.

## 2020-11-09 NOTE — Telephone Encounter (Signed)
Setup appt & contacted patient today.

## 2020-11-18 ENCOUNTER — Ambulatory Visit (HOSPITAL_COMMUNITY)
Admission: RE | Admit: 2020-11-18 | Discharge: 2020-11-18 | Disposition: A | Discharge status: Home / Self Care | Payer: Medicare Other | Source: Ambulatory Visit | Attending: Nurse Practitioner | Admitting: Nurse Practitioner

## 2020-11-18 ENCOUNTER — Other Ambulatory Visit: Payer: Self-pay

## 2020-11-18 DIAGNOSIS — Z1231 Encounter for screening mammogram for malignant neoplasm of breast: Secondary | ICD-10-CM | POA: Insufficient documentation

## 2020-11-24 ENCOUNTER — Other Ambulatory Visit (INDEPENDENT_AMBULATORY_CARE_PROVIDER_SITE_OTHER): Payer: Self-pay | Admitting: Internal Medicine

## 2020-12-14 ENCOUNTER — Other Ambulatory Visit (INDEPENDENT_AMBULATORY_CARE_PROVIDER_SITE_OTHER): Payer: Self-pay | Admitting: Internal Medicine

## 2021-01-28 ENCOUNTER — Ambulatory Visit: Payer: Medicare Other | Admitting: Cardiology

## 2021-01-29 ENCOUNTER — Ambulatory Visit: Payer: Medicare Other | Admitting: Cardiology

## 2021-02-11 ENCOUNTER — Ambulatory Visit (INDEPENDENT_AMBULATORY_CARE_PROVIDER_SITE_OTHER): Payer: Medicare Other | Admitting: Internal Medicine

## 2021-02-11 ENCOUNTER — Other Ambulatory Visit: Payer: Self-pay

## 2021-02-11 ENCOUNTER — Encounter (INDEPENDENT_AMBULATORY_CARE_PROVIDER_SITE_OTHER): Payer: Self-pay | Admitting: Internal Medicine

## 2021-02-11 VITALS — BP 121/80 | Temp 97.2°F | Ht 67.0 in | Wt 223.0 lb

## 2021-02-11 DIAGNOSIS — I10 Essential (primary) hypertension: Secondary | ICD-10-CM

## 2021-02-11 DIAGNOSIS — E119 Type 2 diabetes mellitus without complications: Secondary | ICD-10-CM

## 2021-02-11 DIAGNOSIS — E782 Mixed hyperlipidemia: Secondary | ICD-10-CM

## 2021-02-11 DIAGNOSIS — Z1211 Encounter for screening for malignant neoplasm of colon: Secondary | ICD-10-CM

## 2021-02-11 MED ORDER — ENALAPRIL MALEATE 5 MG PO TABS: 5.0000 mg | ORAL_TABLET | Freq: Every day | ORAL | 1 refills | Status: DC

## 2021-02-11 NOTE — Progress Notes (Signed)
Metrics: Intervention Frequency ACO  Documented Smoking Status Yearly  Screened one or more times in 24 months  Cessation Counseling or  Active cessation medication Past 24 months  Past 24 months   Guideline developer: UpToDate (See UpToDate for funding source) Date Released: 2012-09-06       Wellness Office Visit  Subjective:  Patient ID: [name], female    DOB: [birth year]  Age: 70 y.o. MRN: [id number]  CC: This lady comes in for follow-up of diabetes, hypertension, hyperlipidemia. HPI  She continues on metformin for diabetes.  Her last hemoglobin A1c was in the reasonable range. She continues on enalapril for hypertension.  She has no history of coronary artery disease or cerebrovascular disease. She continues on statin therapy in the face of diabetes. She is tolerating statin therapy well. She complains of what she describes as excessive sweating underneath both breasts and in her lower abdomen.  This causes itching on her skin.  She tends to get it every day.  She denies any hot flashes. Past Medical History:  Diagnosis Date   Allergy    Anemia    Diabetes mellitus without complication ([place])    Hyperlipidemia    Hypertension    Sleep apnea    Past Surgical History:  Procedure Laterality Date   ABDOMINAL HYSTERECTOMY     fibroids   fatty tumor     removed     Family History  Problem Relation Age of Onset   Alzheimer's disease Mother    Cancer Mother        unknown   Alcohol abuse Father    COPD Father    Diabetes Sister    Hypertension Sister    Huntington's disease Daughter     Social History   Social History Narrative   Lives with disabled mother with Alzheimer's   Daughter with Hungtington's passed away Sep 07, 2019.   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   Social History   Tobacco Use   Smoking status: Former    Years: 2.00    Types: Cigarettes    Start date: 08/02/1971    Quit date:  08/01/1986    Years since quitting: 34.5   Smokeless tobacco: Never  Substance Use Topics   Alcohol use: No    Current Meds  Medication Sig   aspirin 81 MG tablet Take 81 mg by mouth daily.   blood glucose meter kit and supplies KIT Check your blood sugar daily before breakfast.   chlorpheniramine (CHLOR-TRIMETON) 4 MG tablet Take 4 mg by mouth daily.   Cholecalciferol (VITAMIN D3) 50 MCG (2000 UT) TABS Take 1 tablet by mouth daily.   glucose blood test strip Test bid   Lancets MISC Test bid   metFORMIN (GLUCOPHAGE) 500 MG tablet Take 1 tablet by mouth once daily with breakfast   Multiple Vitamin (MULTIVITAMINS PO) Take 1 tablet by mouth daily.   mupirocin nasal ointment (BACTROBAN) 2 % Place 1 application into the nose 2 (two) times daily. Use one-half of tube in each nostril twice daily for five (5) days. After application, press sides of nose together and gently massage.   Omega-3 Fatty Acids (FISH OIL) 1000 MG CAPS Take 2,000 mg by mouth 2 (two) times daily.   simvastatin (ZOCOR) 40 MG tablet TAKE 1 TABLET BY MOUTH ONCE DAILY AT  6PM   [DISCONTINUED] enalapril (VASOTEC) 5 MG tablet Take 1 tablet by mouth once daily     Flowsheet Row Clinical Support  from 11/05/2020 in [facility]  PHQ-9 Total Score 0       Objective:   Today's Vitals: BP 121/80 (BP Location: Left Arm, Patient Position: Sitting, Cuff Size: Normal)   Temp (!) 97.2 F (36.2 C) (Temporal)   Ht 5' 7" (1.702 m)   Wt 223 lb (101.2 kg)   BMI 34.93 kg/m  Vitals with BMI 02/11/2021 11/05/2020 06/10/2020  Height 5' 7" 5' 7" 5' 0"  Weight 223 lbs 219 lbs 10 oz 222 lbs 13 oz  BMI 34.92 74.08 14.48  Systolic 185 631 497  Diastolic 80 78 76  Pulse - 84 76     Physical Exam  She looks systemically well but is obese.  Blood pressure is in a good range.     Assessment   1. Type 2 diabetes mellitus without complication, without long-term current use of insulin ([place])   2. Essential hypertension, benign    3. Mixed hyperlipidemia   4. Colon cancer screening       Tests ordered Orders Placed This Encounter  Procedures   COMPLETE METABOLIC PANEL WITH GFR   Hemoglobin A1c   Lipid panel   Ambulatory referral to Gastroenterology      Plan: 1.  Continue with metformin for her diabetes and we will check an A1c. 2.  Continue with enalapril which is controlling her blood pressure.  Check renal function. 3.  Continue with simvastatin and we will check a lipid panel. 4.  I explained to her that I am not sure we can reduce her sweating and it seems that he thinks it may be related to changing washing powder.  She can eliminate the washing powder and see if the sweating continues.  If it does, we will need to probably investigate further. 5.  I will see her in about 3 months time for an annual physical exam.    Meds ordered this encounter  Medications   enalapril (VASOTEC) 5 MG tablet    Sig: Take 1 tablet (5 mg total) by mouth daily.    Dispense:  90 tablet    Refill:  1     Rifka Ramey Luther Parody, MD

## 2021-02-12 LAB — COMPLETE METABOLIC PANEL WITHOUT GFR
AG Ratio: 1.2 (calc) (ref 1.0–2.5)
ALT: 18 U/L (ref 6–29)
AST: 21 U/L (ref 10–35)
Albumin: 4.1 g/dL (ref 3.6–5.1)
Alkaline phosphatase (APISO): 48 U/L (ref 37–153)
BUN: 12 mg/dL (ref 7–25)
CO2: 31 mmol/L (ref 20–32)
Calcium: 10 mg/dL (ref 8.6–10.4)
Chloride: 106 mmol/L (ref 98–110)
Creat: 0.94 mg/dL (ref 0.50–1.05)
Globulin: 3.3 g/dL (ref 1.9–3.7)
Glucose, Bld: 96 mg/dL (ref 65–99)
Potassium: 4.6 mmol/L (ref 3.5–5.3)
Sodium: 144 mmol/L (ref 135–146)
Total Bilirubin: 0.6 mg/dL (ref 0.2–1.2)
Total Protein: 7.4 g/dL (ref 6.1–8.1)
eGFR: 66 mL/min/1.73m2

## 2021-02-12 LAB — LIPID PANEL
Cholesterol: 126 mg/dL (ref <200)
HDL: 32 mg/dL — ABNORMAL LOW (ref >=50)
LDL Cholesterol (Calc): 70 mg/dL (calc)
Non-HDL Cholesterol (Calc): 94 mg/dL (calc) (ref <130)
Total CHOL/HDL Ratio: 3.9 (calc) (ref <5.0)
Triglycerides: 159 mg/dL — ABNORMAL HIGH (ref <150)

## 2021-02-12 LAB — HEMOGLOBIN A1C
Hgb A1c MFr Bld: 7.2 %{Hb} — ABNORMAL HIGH
Mean Plasma Glucose: 160 mg/dL
eAG (mmol/L): 8.9 mmol/L

## 2021-02-16 ENCOUNTER — Ambulatory Visit: Payer: Medicare Other | Admitting: Cardiology

## 2021-02-16 ENCOUNTER — Encounter: Payer: Self-pay | Admitting: Internal Medicine

## 2021-02-16 NOTE — Progress Notes (Signed)
Please call the patient and let her know that the diabetes is getting worse.  She should increase the metformin to 500 mg tablet, 1 tablet twice a day now.  Focus on nutrition as before. Follow-up as scheduled.

## 2021-02-23 ENCOUNTER — Other Ambulatory Visit (INDEPENDENT_AMBULATORY_CARE_PROVIDER_SITE_OTHER): Payer: Self-pay | Admitting: Internal Medicine

## 2021-02-24 ENCOUNTER — Other Ambulatory Visit (INDEPENDENT_AMBULATORY_CARE_PROVIDER_SITE_OTHER): Payer: Self-pay | Admitting: Nurse Practitioner

## 2021-02-24 DIAGNOSIS — E119 Type 2 diabetes mellitus without complications: Secondary | ICD-10-CM

## 2021-02-24 MED ORDER — METFORMIN HCL 500 MG PO TABS: 500.0000 mg | ORAL_TABLET | Freq: Two times a day (BID) | ORAL | 0 refills | Status: DC

## 2021-03-04 ENCOUNTER — Encounter (INDEPENDENT_AMBULATORY_CARE_PROVIDER_SITE_OTHER): Payer: Self-pay

## 2021-03-18 ENCOUNTER — Telehealth (INDEPENDENT_AMBULATORY_CARE_PROVIDER_SITE_OTHER): Payer: Self-pay | Admitting: Nurse Practitioner

## 2021-03-18 DIAGNOSIS — E782 Mixed hyperlipidemia: Secondary | ICD-10-CM

## 2021-03-18 DIAGNOSIS — E119 Type 2 diabetes mellitus without complications: Secondary | ICD-10-CM

## 2021-03-18 MED ORDER — GLUCOSE BLOOD VI STRP: ORAL_STRIP | 6 refills | Status: DC

## 2021-03-18 MED ORDER — LANCETS MISC: 6 refills | Status: DC

## 2021-03-18 MED ORDER — SIMVASTATIN 40 MG PO TABS: 40.0000 mg | ORAL_TABLET | Freq: Every day | ORAL | 0 refills | Status: DC

## 2021-03-18 NOTE — Telephone Encounter (Signed)
Refill approved and sent to pharmacy. 

## 2021-03-18 NOTE — Telephone Encounter (Signed)
Refill approved and sent to [facility]

## 2021-04-15 ENCOUNTER — Encounter: Payer: Self-pay | Admitting: Cardiology

## 2021-04-15 NOTE — Progress Notes (Signed)
Cardiology Office Note  Date: 04/16/2021   ID: Kiala, Faraj 1950-10-04, MRN [id number]  PCP:  Ailene Ards, NP  Cardiologist:  Rozann Lesches, MD Electrophysiologist:  None   Chief Complaint  Patient presents with   Shortness of Breath     History of Present Illness: [name] is a 70 y.o. female referred for cardiology consultation by Ms. Pearline Cables NP for the evaluation of chest pain.  Based on review of records, consultation was requested back in April.  She presents today describing an episode of chest tightness that occurred back in April when she was walking into a store.  She rested when she got inside and symptoms went away within a few minutes.  She does not report similar chest pain but does feel short of breath with activity such as working outdoors in her yard.  When she comes inside and cools off she feels better.  No associated palpitations or syncope.  Cardiac risk factors include known history of type 2 diabetes mellitus, OSA, hypertension, and hyperlipidemia.  She was evaluated in the past by Dr. Johnsie Cancel in 2013 and underwent an exercise Myoview that showed no diagnostic ST segment changes at 6.9 METS and normal perfusion imaging in the setting of breast attenuation artifact.  She has not had follow-up ischemic testing.  I reviewed her medications which are noted below.  Past Medical History:  Diagnosis Date   Allergy    Anemia    Hyperlipidemia    Hypertension    Sleep apnea    Type 2 diabetes mellitus ([place])     Past Surgical History:  Procedure Laterality Date   ABDOMINAL HYSTERECTOMY     fibroids   fatty tumor     removed    Current Outpatient Medications  Medication Sig Dispense Refill   aspirin 81 MG tablet Take 81 mg by mouth daily.     blood glucose meter kit and supplies KIT Check your blood sugar daily before breakfast. 1 each 11   Cholecalciferol (VITAMIN D3) 50 MCG (2000 UT) TABS Take 1 tablet by mouth daily.     enalapril  (VASOTEC) 5 MG tablet Take 1 tablet (5 mg total) by mouth daily. 90 tablet 1   glucose blood test strip Use to test blood sugar daily 100 each 6   Lancets MISC Use to test blood sugar daily 100 each 6   metFORMIN (GLUCOPHAGE) 500 MG tablet Take 1 tablet (500 mg total) by mouth 2 (two) times daily with a meal. 180 tablet 0   Multiple Vitamin (MULTIVITAMINS PO) Take 1 tablet by mouth daily.     mupirocin nasal ointment (BACTROBAN) 2 % Place 1 application into the nose 2 (two) times daily. Use one-half of tube in each nostril twice daily for five (5) days. After application, press sides of nose together and gently massage. 10 g 0   Omega-3 Fatty Acids (FISH OIL) 1000 MG CAPS Take 2,000 mg by mouth 2 (two) times daily.     simvastatin (ZOCOR) 40 MG tablet Take 1 tablet (40 mg total) by mouth daily at 6 PM. 90 tablet 0   chlorpheniramine (CHLOR-TRIMETON) 4 MG tablet Take 4 mg by mouth daily. (Patient not taking: Reported on 04/16/2021)     No current facility-administered medications for this visit.   Allergies:  Sulfonamide derivatives   ROS: No orthopnea or PND.  Physical Exam: VS:  BP 140/84   Pulse 86   Ht $R'5\' 6"'Jh$  (1.676 m)  Wt 223 lb (101.2 kg)   SpO2 97%   BMI 35.99 kg/m , BMI Body mass index is 35.99 kg/m.  Wt Readings from Last 3 Encounters:  04/16/21 223 lb (101.2 kg)  02/11/21 223 lb (101.2 kg)  11/05/20 219 lb 9.6 oz (99.6 kg)    General: Patient appears comfortable at rest. HEENT: Conjunctiva and lids normal, wearing a mask. Neck: Supple, no elevated JVP or carotid bruits, no thyromegaly. Lungs: Clear to auscultation, nonlabored breathing at rest. Cardiac: Regular rate and rhythm, no S3 or significant systolic murmur, no pericardial rub. Abdomen: Soft, nontender, bowel sounds present. Extremities: No pitting edema, distal pulses 2+. Skin: Warm and dry. Musculoskeletal: No kyphosis. Neuropsychiatric: Alert and oriented x3, affect grossly appropriate.  ECG:  An ECG dated  11/05/2020 was personally reviewed today and demonstrated:  Sinus rhythm with decreased R wave progression.  Recent Labwork: 02/11/2021: ALT 18; AST 21; BUN 12; Creat 0.94; Potassium 4.6; Sodium 144     Component Value Date/Time   CHOL 126 02/11/2021 1327   TRIG 159 (H) 02/11/2021 1327   HDL 32 (L) 02/11/2021 1327   CHOLHDL 3.9 02/11/2021 1327   VLDL 18 01/16/2017 1249   LDLCALC 70 02/11/2021 1327    Other Studies Reviewed Today:  Exercise Myoview 03/22/2012: IMPRESSION:  Negative stress nuclear myocardial study revealing impaired  exercise capacity, normal left ventricular size and function, a  negative stress EKG and breast attenuation artifact without  convincing evidence for ischemia or infarction.  Other findings as  noted.   Assessment and Plan:  1.  History of chest pain as discussed above, more consistently dyspnea on exertion however.  Patient is 71 with history of type 2 diabetes mellitus, OSA, hypertension, and hyperlipidemia.  ECG from April showed sinus rhythm with decreased R wave progression.  Last formal ischemic testing was in 2013.  We will plan to proceed with an echocardiogram for cardiac structural evaluation and also a repeat exercise Myoview to reevaluate for ischemia.  2.  Mixed hyperlipidemia, on Zocor.  Recent LDL 70.  3.  Essential hypertension, on Vasotec.  Medication Adjustments/Labs and Tests Ordered: Current medicines are reviewed at length with the patient today.  Concerns regarding medicines are outlined above.   Tests Ordered: Orders Placed This Encounter  Procedures   NM Myocar Multi W/Spect W/Wall Motion / EF   ECHOCARDIOGRAM COMPLETE     Medication Changes: No orders of the defined types were placed in this encounter.   Disposition:  Follow up  test results.  Signed, Satira Sark, MD, [facility] 04/16/2021 2:18 PM    [facility] at [facility] [address], [place], [place] [address] Phone: [phone];  Fax: [phone]

## 2021-04-16 ENCOUNTER — Ambulatory Visit (INDEPENDENT_AMBULATORY_CARE_PROVIDER_SITE_OTHER): Payer: Medicare Other | Admitting: Cardiology

## 2021-04-16 ENCOUNTER — Other Ambulatory Visit: Payer: Self-pay

## 2021-04-16 ENCOUNTER — Encounter: Payer: Self-pay | Admitting: Cardiology

## 2021-04-16 VITALS — BP 140/84 | HR 86 | Ht 66.0 in | Wt 223.0 lb

## 2021-04-16 DIAGNOSIS — I10 Essential (primary) hypertension: Secondary | ICD-10-CM | POA: Diagnosis not present

## 2021-04-16 DIAGNOSIS — E782 Mixed hyperlipidemia: Secondary | ICD-10-CM

## 2021-04-16 DIAGNOSIS — R0602 Shortness of breath: Secondary | ICD-10-CM

## 2021-04-16 DIAGNOSIS — Z87898 Personal history of other specified conditions: Secondary | ICD-10-CM

## 2021-04-16 DIAGNOSIS — R9431 Abnormal electrocardiogram [ECG] [EKG]: Secondary | ICD-10-CM | POA: Diagnosis not present

## 2021-04-16 NOTE — Patient Instructions (Signed)
Medication Instructions:  Your physician recommends that you continue on your current medications as directed. Please refer to the Current Medication list given to you today.  *If you need a refill on your cardiac medications before your next appointment, please call your pharmacy*   Lab Work: None If you have labs (blood work) drawn today and your tests are completely normal, you will receive your results only by: [place] (if you have MyChart) OR A paper copy in the mail If you have any lab test that is abnormal or we need to change your treatment, we will call you to review the results.   Testing/Procedures: Your physician has requested that you have an echocardiogram. Echocardiography is a painless test that uses sound waves to create images of your heart. It provides your doctor with information about the size and shape of your heart and how well your heart's chambers and valves are working. This procedure takes approximately one hour. There are no restrictions for this procedure.  Your physician has requested that you have en exercise stress myoview. For further information please visit [email or web address]. Please follow instruction sheet, as given.     Follow-Up: At [facility], you and your health needs are our priority.  As part of our continuing mission to provide you with exceptional heart care, we have created designated Provider Care Teams.  These Care Teams include your primary Cardiologist (physician) and Advanced Practice Providers (APPs -  Physician Assistants and Nurse Practitioners) who all work together to provide you with the care you need, when you need it.  We recommend signing up for the patient portal called "MyChart".  Sign up information is provided on this After Visit Summary.  MyChart is used to connect with patients for Virtual Visits (Telemedicine).  Patients are able to view lab/test results, encounter notes, upcoming appointments, etc.  Non-urgent  messages can be sent to your provider as well.   To learn more about what you can do with MyChart, go to [email or web address].    Your next appointment:   Follow Up : PENDING    Other Instructions

## 2021-04-27 ENCOUNTER — Ambulatory Visit: Payer: Medicare Other

## 2021-05-10 ENCOUNTER — Telehealth: Payer: Self-pay | Admitting: Cardiology

## 2021-05-10 NOTE — Telephone Encounter (Signed)
Patient notified that Dr.McDowell ok'd lexiscan. Reiterated that she needs to arrive at 0830 hrs for check-in.

## 2021-05-10 NOTE — Telephone Encounter (Signed)
I will forward to Dr.McDowell for dispo. 

## 2021-05-10 NOTE — Telephone Encounter (Signed)
New message     Patient does not think she can do the exercise myoview, can she do the [place] instead? She was out in the yard trying to pick up sticks and leaves and had another episode.

## 2021-05-12 ENCOUNTER — Other Ambulatory Visit: Payer: Self-pay

## 2021-05-12 ENCOUNTER — Ambulatory Visit (HOSPITAL_COMMUNITY)
Admission: RE | Admit: 2021-05-12 | Discharge: 2021-05-12 | Disposition: A | Discharge status: Home / Self Care | Payer: Medicare Other | Source: Ambulatory Visit | Attending: Cardiology | Admitting: Cardiology

## 2021-05-12 DIAGNOSIS — Z87898 Personal history of other specified conditions: Secondary | ICD-10-CM | POA: Diagnosis not present

## 2021-05-12 DIAGNOSIS — R0602 Shortness of breath: Secondary | ICD-10-CM | POA: Insufficient documentation

## 2021-05-12 LAB — NM MYOCAR MULTI W/SPECT W/WALL MOTION / EF
Base ST Depression (mm): 0 mm
Estimated workload: 1
Exercise duration (min): 0 min
Exercise duration (sec): 0 s
LV dias vol: 55 mL (ref 46–106)
LV sys vol: 18 mL
MPHR: 152 {beats}/min
Nuc Stress EF: 67 %
Peak HR: 94 {beats}/min
Percent HR: 61 %
RATE: 0.4
Rest HR: 73 {beats}/min
Rest Nuclear Isotope Dose: 10.2 mCi
SDS: 0
SRS: 0
SSS: 0
ST Depression (mm): 0 mm
Stress Nuclear Isotope Dose: 30 mCi
TID: 1.07

## 2021-05-12 LAB — ECHOCARDIOGRAM COMPLETE
AR max vel: 2.23 cm2
AV Area VTI: 2.37 cm2
AV Area mean vel: 2.05 cm2
AV Mean grad: 10 mmHg
AV Peak grad: 19.3 mmHg
Ao pk vel: 2.2 m/s
S' Lateral: 2 cm
Single Plane A4C EF: 67.8 %

## 2021-05-12 MED ORDER — TECHNETIUM TC 99M TETROFOSMIN IV KIT
30.0000 | PACK | Freq: Once | INTRAVENOUS | Status: AC | PRN
  Administered 2021-05-12: 30 via INTRAVENOUS

## 2021-05-12 MED ORDER — TECHNETIUM TC 99M TETROFOSMIN IV KIT
10.0000 | PACK | Freq: Once | INTRAVENOUS | Status: AC | PRN
  Administered 2021-05-12: 10.2 via INTRAVENOUS

## 2021-05-12 MED ORDER — REGADENOSON 0.4 MG/5ML IV SOLN
INTRAVENOUS | Status: AC
  Administered 2021-05-12: 0.4 mg via INTRAVENOUS
  Filled 2021-05-12: qty 5

## 2021-05-12 MED ORDER — SODIUM CHLORIDE FLUSH 0.9 % IV SOLN
INTRAVENOUS | Status: AC
  Administered 2021-05-12: 10 mL via INTRAVENOUS
  Filled 2021-05-12: qty 10

## 2021-05-12 NOTE — Progress Notes (Signed)
  Echocardiogram 2D Echocardiogram has been performed.  [name] 05/12/2021, 11:42 AM

## 2021-05-17 ENCOUNTER — Telehealth: Payer: Self-pay | Admitting: Cardiology

## 2021-05-17 ENCOUNTER — Encounter (INDEPENDENT_AMBULATORY_CARE_PROVIDER_SITE_OTHER): Payer: Medicare Other | Admitting: Internal Medicine

## 2021-05-17 NOTE — Telephone Encounter (Signed)
New message    Patient calling back for stress test results

## 2021-05-17 NOTE — Telephone Encounter (Signed)
Patient notified of results and verbalized understanding. Pt had no questions or concerns at this time.

## 2021-05-17 NOTE — Telephone Encounter (Signed)
-----   Message from Satira Sark, MD sent at 05/14/2021  6:23 PM EDT ----- Results reviewed.  Please let her know that the stress test was low risk, no evidence of ischemia to suggest obstructive CAD as cause of her symptoms and normal LVEF greater than 65%.  Do not anticipate further cardiac testing unless symptoms escalate, would keep follow-up with PCP.

## 2021-05-17 NOTE — Telephone Encounter (Signed)
-----   Message from Satira Sark, MD sent at 05/14/2021  6:23 PM EDT ----- Results reviewed.  LVEF vigorous at 70 to 75%, no significant valvular abnormalities.  Please see further discussion on Myoview report.

## 2021-05-31 ENCOUNTER — Other Ambulatory Visit: Payer: Self-pay

## 2021-05-31 ENCOUNTER — Encounter: Payer: Self-pay | Admitting: *Deleted

## 2021-05-31 ENCOUNTER — Ambulatory Visit (INDEPENDENT_AMBULATORY_CARE_PROVIDER_SITE_OTHER): Payer: Self-pay | Admitting: *Deleted

## 2021-05-31 VITALS — Ht 67.0 in | Wt 224.0 lb

## 2021-05-31 DIAGNOSIS — Z1211 Encounter for screening for malignant neoplasm of colon: Secondary | ICD-10-CM

## 2021-05-31 MED ORDER — PEG 3350-KCL-NA BICARB-NACL 420 G PO SOLR: 4000.0000 mL | Freq: Once | ORAL | 0 refills | Status: AC

## 2021-05-31 NOTE — Progress Notes (Addendum)
Gastroenterology Pre-Procedure Review  Request Date: 05/31/2021 Requesting Physician: Dr. Hurshel Party, no previous TCS  PATIENT REVIEW QUESTIONS: The patient responded to the following health history questions as indicated:    1. Diabetes Melitis: yes, type II  2. Joint replacements in the past 12 months: no 3. Major health problems in the past 3 months: no 4. Has an artificial valve or MVP: no 5. Has a defibrillator: no 6. Has been advised in past to take antibiotics in advance of a procedure like teeth cleaning: no 7. Family history of colon cancer: no  8. Alcohol Use: no 9. Illicit drug Use: no 10. History of sleep apnea: no  11. History of coronary artery or other vascular stents placed within the last 12 months: no 12. History of any prior anesthesia complications: no 13. Body mass index is 35.08 kg/m.    MEDICATIONS & ALLERGIES:    Patient reports the following regarding taking any blood thinners:   Plavix? no Aspirin? Yes, 81 mg Coumadin? no Brilinta? no Xarelto? no Eliquis? no Pradaxa? no Savaysa? no Effient? no  Patient confirms/reports the following medications:  Current Outpatient Medications  Medication Sig Dispense Refill   aspirin 81 MG tablet Take 81 mg by mouth daily.     chlorpheniramine (CHLOR-TRIMETON) 4 MG tablet Take 4 mg by mouth daily.     Cholecalciferol (VITAMIN D3) 50 MCG (2000 UT) TABS Take 1 tablet by mouth daily.     enalapril (VASOTEC) 5 MG tablet Take 1 tablet (5 mg total) by mouth daily. 90 tablet 1   metFORMIN (GLUCOPHAGE) 500 MG tablet Take 1 tablet (500 mg total) by mouth 2 (two) times daily with a meal. 180 tablet 0   Multiple Vitamin (MULTIVITAMINS PO) Take 1 tablet by mouth daily.     mupirocin nasal ointment (BACTROBAN) 2 % Place 1 application into the nose 2 (two) times daily. Use one-half of tube in each nostril twice daily for five (5) days. After application, press sides of nose together and gently massage. 10 g 0   Omega-3  Fatty Acids (FISH OIL) 1000 MG CAPS Take 2,000 mg by mouth 2 (two) times daily.     simvastatin (ZOCOR) 40 MG tablet Take 1 tablet (40 mg total) by mouth daily at 6 PM. 90 tablet 0   No current facility-administered medications for this visit.    Patient confirms/reports the following allergies:  Allergies  Allergen Reactions   Sulfonamide Derivatives Other (See Comments)    Does not recall    No orders of the defined types were placed in this encounter.   AUTHORIZATION INFORMATION Primary Insurance: [facility] Medicare ,  ID #: [id number],  Group #: [id number] Pre-Cert / Auth required: Yes, approved per Al 19/14/7829-5/62/1308 Pre-Cert / Auth #: [id number]  SCHEDULE INFORMATION: Procedure has been scheduled as follows:  Date: 06/22/2021, Time: 1:00  Location: [facility] with Dr. Abbey Chatters  This Gastroenterology Pre-Precedure Review Form is being routed to the following provider(s): Neil Crouch, PA-C

## 2021-06-02 ENCOUNTER — Encounter: Payer: Self-pay | Admitting: *Deleted

## 2021-06-02 NOTE — Progress Notes (Signed)
OK to schedule. ASA II Day of prep: AM metformin dose only AM of TCS: hold metformin

## 2021-06-02 NOTE — Progress Notes (Signed)
Mailed letter to pt with diabetes medication adjustments.   

## 2021-06-03 ENCOUNTER — Other Ambulatory Visit: Payer: Self-pay | Admitting: *Deleted

## 2021-06-10 ENCOUNTER — Encounter: Payer: Self-pay | Admitting: Internal Medicine

## 2021-06-10 ENCOUNTER — Ambulatory Visit (INDEPENDENT_AMBULATORY_CARE_PROVIDER_SITE_OTHER): Payer: Medicare Other | Admitting: Internal Medicine

## 2021-06-10 ENCOUNTER — Other Ambulatory Visit: Payer: Self-pay

## 2021-06-10 VITALS — BP 132/74 | HR 84 | Temp 97.9°F | Resp 18 | Ht 67.0 in | Wt 223.1 lb

## 2021-06-10 DIAGNOSIS — E782 Mixed hyperlipidemia: Secondary | ICD-10-CM

## 2021-06-10 DIAGNOSIS — L304 Erythema intertrigo: Secondary | ICD-10-CM | POA: Insufficient documentation

## 2021-06-10 DIAGNOSIS — E1159 Type 2 diabetes mellitus with other circulatory complications: Secondary | ICD-10-CM

## 2021-06-10 DIAGNOSIS — I10 Essential (primary) hypertension: Secondary | ICD-10-CM | POA: Diagnosis not present

## 2021-06-10 DIAGNOSIS — Z8639 Personal history of other endocrine, nutritional and metabolic disease: Secondary | ICD-10-CM | POA: Insufficient documentation

## 2021-06-10 DIAGNOSIS — E669 Obesity, unspecified: Secondary | ICD-10-CM

## 2021-06-10 DIAGNOSIS — E119 Type 2 diabetes mellitus without complications: Secondary | ICD-10-CM | POA: Diagnosis not present

## 2021-06-10 DIAGNOSIS — M8580 Other specified disorders of bone density and structure, unspecified site: Secondary | ICD-10-CM | POA: Diagnosis not present

## 2021-06-10 DIAGNOSIS — E559 Vitamin D deficiency, unspecified: Secondary | ICD-10-CM

## 2021-06-10 DIAGNOSIS — J309 Allergic rhinitis, unspecified: Secondary | ICD-10-CM | POA: Diagnosis not present

## 2021-06-10 DIAGNOSIS — Z23 Encounter for immunization: Secondary | ICD-10-CM

## 2021-06-10 DIAGNOSIS — D229 Melanocytic nevi, unspecified: Secondary | ICD-10-CM | POA: Insufficient documentation

## 2021-06-10 MED ORDER — FLUTICASONE PROPIONATE 50 MCG/ACT NA SUSP: 2.0000 | Freq: Every day | NASAL | 6 refills | Status: AC

## 2021-06-10 MED ORDER — ENALAPRIL MALEATE 5 MG PO TABS: 5.0000 mg | ORAL_TABLET | Freq: Every day | ORAL | 1 refills | Status: DC

## 2021-06-10 MED ORDER — METFORMIN HCL 500 MG PO TABS: 500.0000 mg | ORAL_TABLET | Freq: Two times a day (BID) | ORAL | 1 refills | Status: DC

## 2021-06-10 MED ORDER — SIMVASTATIN 40 MG PO TABS: 40.0000 mg | ORAL_TABLET | Freq: Every day | ORAL | 1 refills | Status: DC

## 2021-06-10 MED ORDER — KETOCONAZOLE 2 % EX CREA: 1.0000 "application " | TOPICAL_CREAM | Freq: Every day | CUTANEOUS | 0 refills | Status: DC

## 2021-06-10 NOTE — Assessment & Plan Note (Signed)
BP Readings from Last 1 Encounters:  06/10/21 132/74   Well-controlled with enalapril Counseled for compliance with the medications Advised DASH diet and moderate exercise/walking, at least 150 mins/week

## 2021-06-10 NOTE — Assessment & Plan Note (Signed)
Was on thyroid medicine in the past Check TSH and free T4

## 2021-06-10 NOTE — Progress Notes (Addendum)
New Patient Office Visit  Subjective:  Patient ID: [name], female    DOB: 1951-05-13  Age: 70 y.o. MRN: [id number]  CC:  Chief Complaint  Patient presents with   New Patient (Initial Visit)    New patient just establishing care pt has had runny nose and has sore in nose    HPI [name] is a 70 y.o. female with past medical history of HTN, allergic sinusitis, type II DM, osteopenia, HLD and obesity who presents for establishing care.  HTN: BP is well-controlled. Takes medications regularly. Patient denies headache, dizziness, chest pain or palpitations.  Type II DM with HLD: She takes metformin for it.  Her last HbA1c was 7.2.  She denies any polyuria or polydipsia.  She takes Zocor for HLD.  Osteopenia: Last DEXA scan in 2019 showed osteopenia.  She takes vitamin D supplements.  Chart review suggests history of hyperparathyroidism, but last BMP showed normal calcium levels.  She complains of runny nose and throat irritation especially after yard work.  She has tried OTC antihistaminic solution with some relief.  She also reports throat pain especially at nighttime.  She complains of rash underneath the breast and in the groin area, which has been itching chronically.  She has tried OTC powder with no relief, unable to provide further detail about the powder.  She also reports having 2 moles over her abdominal wall, which have been stable in size and color.  Denies any itching or irritation in that area.  She has had 3 doses of COVID-vaccine.  She received flu vaccine in the office today.  Past Medical History:  Diagnosis Date   Allergy    Anemia    Hyperlipidemia    Hypertension    Sleep apnea    Type 2 diabetes mellitus (HCC)     Past Surgical History:  Procedure Laterality Date   ABDOMINAL HYSTERECTOMY     fibroids   fatty tumor     removed    Family History  Problem Relation Age of Onset   Alzheimer's disease Mother    Cancer Mother         unknown   Alcohol abuse Father    COPD Father    Diabetes Sister    Hypertension Sister    Huntington's disease Daughter     Social History   Socioeconomic History   Marital status: Divorced    Spouse name: Not on file   Number of children: 2   Years of education: Not on file   Highest education level: 11th grade  Occupational History   Occupation: unemployed    Comment: caregiver  Tobacco Use   Smoking status: Former    Years: 2.00    Types: Cigarettes    Start date: 08/02/1971    Quit date: 08/01/1986    Years since quitting: 34.8   Smokeless tobacco: Never  Vaping Use   Vaping Use: Never used  Substance and Sexual Activity   Alcohol use: No   Drug use: No   Sexual activity: Never    Birth control/protection: Surgical  Other Topics Concern   Not on file  Social History Narrative   Lives with disabled mother with Alzheimer's   Daughter with Hungtington's passed away 08-20-2019.   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   Social Determinants of Health   Financial Resource Strain: Not on file  Food Insecurity: Not on file  Transportation Needs: Not on file  Physical Activity: Not on file  Stress: Not on file  Social Connections: Not on file  Intimate Partner Violence: Not on file    ROS Review of Systems  Constitutional:  Negative for chills and fever.  HENT:  Positive for congestion, sinus pressure and sore throat. Negative for sinus pain.   Eyes:  Negative for pain and discharge.  Respiratory:  Negative for cough and wheezing.   Cardiovascular:  Negative for chest pain and palpitations.  Gastrointestinal:  Negative for abdominal pain, diarrhea, nausea and vomiting.  Endocrine: Negative for polydipsia and polyuria.  Genitourinary:  Negative for dysuria and hematuria.  Musculoskeletal:  Negative for neck pain and neck stiffness.  Skin:        Mole over abdominal wall  Neurological:  Negative for dizziness  and weakness.  Psychiatric/Behavioral:  Negative for agitation and behavioral problems.    Objective:   Today's Vitals: BP 132/74 (BP Location: Left Arm, Patient Position: Sitting, Cuff Size: Normal)   Pulse 84   Temp 97.9 F (36.6 C) (Oral)   Resp 18   Ht _0  (1.702 m)   Wt 223 lb 1.9 oz (101.2 kg)   SpO2 100%   BMI 34.95 kg/m   Physical Exam Vitals reviewed.  Constitutional:      General: She is not in acute distress.    Appearance: She is not diaphoretic.  HENT:     Head: Normocephalic and atraumatic.     Nose: Nose normal.     Mouth/Throat:     Mouth: Mucous membranes are moist.  Eyes:     General: No scleral icterus.    Extraocular Movements: Extraocular movements intact.  Cardiovascular:     Rate and Rhythm: Normal rate and regular rhythm.     Pulses: Normal pulses.     Heart sounds: Normal heart sounds. No murmur heard. Pulmonary:     Breath sounds: Normal breath sounds. No wheezing or rales.  Abdominal:     Palpations: Abdomen is soft.     Tenderness: There is no abdominal tenderness.  Musculoskeletal:     Cervical back: Neck supple. No tenderness.     Right lower leg: No edema.     Left lower leg: No edema.  Skin:    General: Skin is warm.     Comments: 2 brownish moles with clear margin on left side of abdominal wall  Neurological:     General: No focal deficit present.     Mental Status: She is alert and oriented to person, place, and time.  Psychiatric:        Mood and Affect: Mood normal.        Behavior: Behavior normal.    Assessment & Plan:   Problem List Items Addressed This Visit       Cardiovascular and Mediastinum   Essential hypertension, benign - Primary    BP Readings from Last 1 Encounters:  06/10/21 132/74  Well-controlled with enalapril Counseled for compliance with the medications Advised DASH diet and moderate exercise/walking, at least 150 mins/week       Relevant Medications   simvastatin (ZOCOR) 40 MG tablet    enalapril (VASOTEC) 5 MG tablet     Respiratory   Allergic sinusitis    Started Flonase Advised to use humidifier at nighttime.      Relevant Medications   fluticasone (FLONASE) 50 MCG/ACT nasal spray     Endocrine   Diabetes [facility])    Lab Results  Component Value Date   HGBA1C  7.2 (H) 02/11/2021   On Metformin 500 mg BID Advised to follow diabetic diet On statin and ACEi F/u CMP and lipid panel Diabetic foot exam: Today Diabetic eye exam: Advised to follow up with Ophthalmology for diabetic eye exam      Relevant Medications   metFORMIN (GLUCOPHAGE) 500 MG tablet   simvastatin (ZOCOR) 40 MG tablet   enalapril (VASOTEC) 5 MG tablet   Other Relevant Orders   CMP14+EGFR   Hemoglobin A1c     Musculoskeletal and Integument   Osteopenia    Takes vitamin D supplement Has a history of ?hyperparathyroidism, check CMP      Skin mole    On abdominal wall Appears to be benign, no recent change in color or size Referred to dermatology for further eval      Relevant Orders   Ambulatory referral to Dermatology   Intertrigo    In groin and underneath breast area Ketoconazole cream Advised to keep area clean and dry      Relevant Medications   ketoconazole (NIZORAL) 2 % cream     Other   Hyperlipidemia    On statin      Relevant Medications   simvastatin (ZOCOR) 40 MG tablet   enalapril (VASOTEC) 5 MG tablet   Obesity (BMI 30-39.9)    Diet modification and moderate exercise advised      Relevant Medications   metFORMIN (GLUCOPHAGE) 500 MG tablet   History of hypothyroidism    Was on thyroid medicine in the past Check TSH and free T4      Relevant Orders   TSH + free T4   Other Visit Diagnoses     Vitamin D deficiency       Relevant Orders   Vitamin D (25 hydroxy)   Need for immunization against influenza       Relevant Orders   Flu Vaccine QUAD High Dose(Fluad) (Completed)       Outpatient Encounter Medications as of 06/10/2021  Medication Sig    aspirin 81 MG tablet Take 81 mg by mouth daily.   chlorpheniramine (CHLOR-TRIMETON) 4 MG tablet Take 4 mg by mouth daily.   Cholecalciferol (VITAMIN D3) 50 MCG (2000 UT) TABS Take 1 tablet by mouth daily.   fluticasone (FLONASE) 50 MCG/ACT nasal spray Place 2 sprays into both nostrils daily.   ketoconazole (NIZORAL) 2 % cream Apply 1 application topically daily.   Multiple Vitamin (MULTIVITAMINS PO) Take 1 tablet by mouth daily.   mupirocin nasal ointment (BACTROBAN) 2 % Place 1 application into the nose 2 (two) times daily. Use one-half of tube in each nostril twice daily for five (5) days. After application, press sides of nose together and gently massage.   Omega-3 Fatty Acids (FISH OIL) 1000 MG CAPS Take 2,000 mg by mouth 2 (two) times daily.   [DISCONTINUED] enalapril (VASOTEC) 5 MG tablet Take 1 tablet (5 mg total) by mouth daily.   [DISCONTINUED] metFORMIN (GLUCOPHAGE) 500 MG tablet Take 1 tablet (500 mg total) by mouth 2 (two) times daily with a meal.   [DISCONTINUED] simvastatin (ZOCOR) 40 MG tablet Take 1 tablet (40 mg total) by mouth daily at 6 PM.   enalapril (VASOTEC) 5 MG tablet Take 1 tablet (5 mg total) by mouth daily.   metFORMIN (GLUCOPHAGE) 500 MG tablet Take 1 tablet (500 mg total) by mouth 2 (two) times daily with a meal.   simvastatin (ZOCOR) 40 MG tablet Take 1 tablet (40 mg total) by mouth daily at 6 PM.  No facility-administered encounter medications on file as of 06/10/2021.    Follow-up: Return in about 4 months (around 10/08/2021).   Lindell Spar, MD

## 2021-06-10 NOTE — Assessment & Plan Note (Signed)
Diet modification and moderate exercise advised. 

## 2021-06-10 NOTE — Patient Instructions (Signed)
Please continue taking medications as prescribed.  Please use Flonase for allergies. Try to use humidifier at nighttime.  Please apply Ketoconazole cream for itching in groin/underneath the breast area. Keep area clean and dry.  Please continue to follow low carb diet and ambulate as tolerated.

## 2021-06-10 NOTE — Assessment & Plan Note (Signed)
Takes vitamin D supplement Has a history of ?hyperparathyroidism, check CMP

## 2021-06-10 NOTE — Assessment & Plan Note (Signed)
In groin and underneath breast area Ketoconazole cream Advised to keep area clean and dry

## 2021-06-10 NOTE — Assessment & Plan Note (Signed)
Started Flonase Advised to use humidifier at nighttime.

## 2021-06-10 NOTE — Assessment & Plan Note (Signed)
On statin.

## 2021-06-10 NOTE — Assessment & Plan Note (Signed)
On abdominal wall Appears to be benign, no recent change in color or size Referred to dermatology for further eval

## 2021-06-10 NOTE — Assessment & Plan Note (Signed)
Lab Results  Component Value Date   HGBA1C 7.2 (H) 02/11/2021    On Metformin 500 mg BID Advised to follow diabetic diet On statin and ACEi F/u CMP and lipid panel Diabetic foot exam: Today Diabetic eye exam: Advised to follow up with Ophthalmology for diabetic eye exam

## 2021-06-11 LAB — CMP14+EGFR
ALT: 18 IU/L (ref 0–32)
AST: 19 IU/L (ref 0–40)
Albumin/Globulin Ratio: 1.5 (ref 1.2–2.2)
Albumin: 4.4 g/dL (ref 3.8–4.8)
Alkaline Phosphatase: 64 IU/L (ref 44–121)
BUN/Creatinine Ratio: 11 — ABNORMAL LOW (ref 12–28)
BUN: 9 mg/dL (ref 8–27)
Bilirubin Total: <0.2 mg/dL (ref 0.0–1.2)
CO2: 28 mmol/L (ref 20–29)
Calcium: 10 mg/dL (ref 8.7–10.3)
Chloride: 104 mmol/L (ref 96–106)
Creatinine, Ser: 0.82 mg/dL (ref 0.57–1.00)
Globulin, Total: 3 g/dL (ref 1.5–4.5)
Glucose: 123 mg/dL — ABNORMAL HIGH (ref 70–99)
Potassium: 4.8 mmol/L (ref 3.5–5.2)
Sodium: 143 mmol/L (ref 134–144)
Total Protein: 7.4 g/dL (ref 6.0–8.5)
eGFR: 77 mL/min/{1.73_m2} (ref >59)

## 2021-06-11 LAB — HEMOGLOBIN A1C
Est. average glucose Bld gHb Est-mCnc: 151 mg/dL
Hgb A1c MFr Bld: 6.9 % — ABNORMAL HIGH (ref 4.8–5.6)

## 2021-06-11 LAB — TSH+FREE T4
Free T4: 1.24 ng/dL (ref 0.82–1.77)
TSH: 1.85 u[IU]/mL (ref 0.450–4.500)

## 2021-06-11 LAB — VITAMIN D 25 HYDROXY (VIT D DEFICIENCY, FRACTURES): Vit D, 25-Hydroxy: 50.7 ng/mL (ref 30.0–100.0)

## 2021-06-15 ENCOUNTER — Other Ambulatory Visit: Payer: Self-pay | Admitting: *Deleted

## 2021-06-15 DIAGNOSIS — I7090 Unspecified atherosclerosis: Secondary | ICD-10-CM

## 2021-06-22 ENCOUNTER — Encounter (HOSPITAL_COMMUNITY): Payer: Self-pay

## 2021-06-22 ENCOUNTER — Ambulatory Visit (HOSPITAL_COMMUNITY)
Admission: RE | Admit: 2021-06-22 | Discharge: 2021-06-22 | Disposition: A | Discharge status: Home / Self Care | Payer: Medicare Other | Attending: Internal Medicine | Admitting: Internal Medicine

## 2021-06-22 ENCOUNTER — Encounter (HOSPITAL_COMMUNITY)
Admission: RE | Disposition: A | Discharge status: Home / Self Care | Payer: Self-pay | Source: Home / Self Care | Attending: Internal Medicine

## 2021-06-22 ENCOUNTER — Ambulatory Visit (HOSPITAL_COMMUNITY): Payer: Medicare Other | Admitting: Anesthesiology

## 2021-06-22 ENCOUNTER — Other Ambulatory Visit: Payer: Self-pay

## 2021-06-22 DIAGNOSIS — K648 Other hemorrhoids: Secondary | ICD-10-CM | POA: Diagnosis not present

## 2021-06-22 DIAGNOSIS — D124 Benign neoplasm of descending colon: Secondary | ICD-10-CM | POA: Diagnosis not present

## 2021-06-22 DIAGNOSIS — D123 Benign neoplasm of transverse colon: Secondary | ICD-10-CM | POA: Diagnosis not present

## 2021-06-22 DIAGNOSIS — Z7984 Long term (current) use of oral hypoglycemic drugs: Secondary | ICD-10-CM | POA: Diagnosis not present

## 2021-06-22 DIAGNOSIS — E119 Type 2 diabetes mellitus without complications: Secondary | ICD-10-CM | POA: Diagnosis not present

## 2021-06-22 DIAGNOSIS — Z87891 Personal history of nicotine dependence: Secondary | ICD-10-CM | POA: Diagnosis not present

## 2021-06-22 DIAGNOSIS — Z79899 Other long term (current) drug therapy: Secondary | ICD-10-CM | POA: Insufficient documentation

## 2021-06-22 DIAGNOSIS — K5730 Diverticulosis of large intestine without perforation or abscess without bleeding: Secondary | ICD-10-CM | POA: Insufficient documentation

## 2021-06-22 DIAGNOSIS — Z139 Encounter for screening, unspecified: Secondary | ICD-10-CM | POA: Diagnosis not present

## 2021-06-22 DIAGNOSIS — D128 Benign neoplasm of rectum: Secondary | ICD-10-CM | POA: Diagnosis not present

## 2021-06-22 DIAGNOSIS — I10 Essential (primary) hypertension: Secondary | ICD-10-CM | POA: Diagnosis not present

## 2021-06-22 DIAGNOSIS — K621 Rectal polyp: Secondary | ICD-10-CM

## 2021-06-22 DIAGNOSIS — K635 Polyp of colon: Secondary | ICD-10-CM | POA: Diagnosis not present

## 2021-06-22 DIAGNOSIS — Z1211 Encounter for screening for malignant neoplasm of colon: Secondary | ICD-10-CM

## 2021-06-22 HISTORY — PX: COLONOSCOPY WITH PROPOFOL: SHX5780

## 2021-06-22 HISTORY — PX: POLYPECTOMY: SHX5525

## 2021-06-22 LAB — GLUCOSE, CAPILLARY: Glucose-Capillary: 95 mg/dL (ref 70–99)

## 2021-06-22 SURGERY — COLONOSCOPY WITH PROPOFOL
Anesthesia: General

## 2021-06-22 MED ORDER — LACTATED RINGERS IV SOLN: INTRAVENOUS | Status: DC

## 2021-06-22 MED ORDER — PROPOFOL 500 MG/50ML IV EMUL
INTRAVENOUS | Status: DC | PRN
  Administered 2021-06-22: 150 ug/kg/min via INTRAVENOUS

## 2021-06-22 MED ORDER — PROPOFOL 10 MG/ML IV BOLUS
INTRAVENOUS | Status: DC | PRN
  Administered 2021-06-22: 100 mg via INTRAVENOUS

## 2021-06-22 NOTE — H&P (Signed)
Primary Care Physician:  Lindell Spar, MD Primary Gastroenterologist:  Dr. Abbey Chatters  Pre-Procedure History & Physical: HPI:  [name] is a 70 y.o. female is here for her first colonoscopy for colon cancer screening purposes.  Patient denies any family history of colorectal cancer.  No melena or hematochezia.  No abdominal pain or unintentional weight loss.  No change in bowel habits.  Overall feels well from a GI standpoint.  Past Medical History:  Diagnosis Date   Allergy    Anemia    Hyperlipidemia    Hypertension    Sleep apnea    Type 2 diabetes mellitus ([place])     Past Surgical History:  Procedure Laterality Date   ABDOMINAL HYSTERECTOMY     fibroids   fatty tumor     removed    Prior to Admission medications   Medication Sig Start Date End Date Taking? Authorizing Provider  aspirin 81 MG tablet Take 81 mg by mouth daily.   Yes [provider]  Cholecalciferol (VITAMIN D3) 50 MCG (2000 UT) TABS Take 2,000 Units by mouth daily.   Yes [provider]  enalapril (VASOTEC) 5 MG tablet Take 1 tablet (5 mg total) by mouth daily. 06/10/21  Yes Lindell Spar, MD  metFORMIN (GLUCOPHAGE) 500 MG tablet Take 1 tablet (500 mg total) by mouth 2 (two) times daily with a meal. 06/10/21  Yes Lindell Spar, MD  Multiple Vitamin (MULTIVITAMINS PO) Take 1 tablet by mouth daily.   Yes [provider]  Omega-3 Fatty Acids (FISH OIL) 1000 MG CAPS Take 2,000 mg by mouth 2 (two) times daily.   Yes [provider]  simvastatin (ZOCOR) 40 MG tablet Take 1 tablet (40 mg total) by mouth daily at 6 PM. 06/10/21  Yes Posey Pronto, Colin Broach, MD  chlorpheniramine (CHLOR-TRIMETON) 4 MG tablet Take 4 mg by mouth daily.    [provider]  fluticasone (FLONASE) 50 MCG/ACT nasal spray Place 2 sprays into both nostrils daily. 06/10/21   Lindell Spar, MD  ketoconazole (NIZORAL) 2 % cream Apply 1 application topically daily. 06/10/21   Lindell Spar, MD   mupirocin nasal ointment (BACTROBAN) 2 % Place 1 application into the nose 2 (two) times daily. Use one-half of tube in each nostril twice daily for five (5) days. After application, press sides of nose together and gently massage. Patient not taking: Reported on 06/16/2021 11/05/20   Ailene Ards, NP    Allergies as of 06/02/2021 - Review Complete 05/31/2021  Allergen Reaction Noted   Sulfonamide derivatives Other (See Comments)     Family History  Problem Relation Age of Onset   Alzheimer's disease Mother    Cancer Mother        unknown   Alcohol abuse Father    COPD Father    Diabetes Sister    Hypertension Sister    Huntington's disease Daughter     Social History   Socioeconomic History   Marital status: Divorced    Spouse name: Not on file   Number of children: 2   Years of education: Not on file   Highest education level: 11th grade  Occupational History   Occupation: unemployed    Comment: caregiver  Tobacco Use   Smoking status: Former    Years: 2.00    Types: Cigarettes    Start date: 08/02/1971    Quit date: 08/01/1986    Years since quitting: 34.9   Smokeless tobacco: Never  Vaping  Use   Vaping Use: Never used  Substance and Sexual Activity   Alcohol use: No   Drug use: No   Sexual activity: Never    Birth control/protection: Surgical  Other Topics Concern   Not on file  Social History Narrative   Lives with disabled mother with Alzheimer's   Daughter with Hungtington's passed away 08-27-2019.   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   Social Determinants of Health   Financial Resource Strain: Not on file  Food Insecurity: Not on file  Transportation Needs: Not on file  Physical Activity: Not on file  Stress: Not on file  Social Connections: Not on file  Intimate Partner Violence: Not on file    Review of Systems: See HPI, otherwise negative ROS  Physical Exam: Vital signs in last 24  hours: Temp:  [98.1 F (36.7 C)] 98.1 F (36.7 C) (11/22 1201) Pulse Rate:  [70] 70 (11/22 1201) Resp:  [12] 12 (11/22 1201) BP: (143)/(90) 143/90 (11/22 1201) SpO2:  [100 %] 100 % (11/22 1201)   General:   Alert,  Well-developed, well-nourished, pleasant and cooperative in NAD Head:  Normocephalic and atraumatic. Eyes:  Sclera clear, no icterus.   Conjunctiva pink. Ears:  Normal auditory acuity. Nose:  No deformity, discharge,  or lesions. Mouth:  No deformity or lesions, dentition normal. Neck:  Supple; no masses or thyromegaly. Lungs:  Clear throughout to auscultation.   No wheezes, crackles, or rhonchi. No acute distress. Heart:  Regular rate and rhythm; no murmurs, clicks, rubs,  or gallops. Abdomen:  Soft, nontender and nondistended. No masses, hepatosplenomegaly or hernias noted. Normal bowel sounds, without guarding, and without rebound.   Msk:  Symmetrical without gross deformities. Normal posture. Extremities:  Without clubbing or edema. Neurologic:  Alert and  oriented x4;  grossly normal neurologically. Skin:  Intact without significant lesions or rashes. Cervical Nodes:  No significant cervical adenopathy. Psych:  Alert and cooperative. Normal mood and affect.  Impression/Plan: [name] is here for a colonoscopy to be performed for colon cancer screening purposes.  The risks of the procedure including infection, bleed, or perforation as well as benefits, limitations, alternatives and imponderables have been reviewed with the patient. Questions have been answered. All parties agreeable.

## 2021-06-22 NOTE — Op Note (Signed)
[facility] Patient Name: [name] Procedure Date: 06/22/2021 12:39 PM MRN: [id number] Date of Birth: [birth year] Attending MD: Elon Alas. Edgar Frisk CSN: [id number] Age: 70 Admit Type: Outpatient Procedure:                Colonoscopy Indications:              Screening for colorectal malignant neoplasm Providers:                Elon Alas. Abbey Chatters, DO, Janeece Riggers, RN, Nelma Rothman,                            Technician Referring MD:              Medicines:                See the Anesthesia note for documentation of the                            administered medications Complications:            No immediate complications. Estimated Blood Loss:     Estimated blood loss was minimal. Procedure:                Pre-Anesthesia Assessment:                           - The anesthesia plan was to use monitored                            anesthesia care (MAC).                           After obtaining informed consent, the colonoscope                            was passed under direct vision. Throughout the                            procedure, the patient's blood pressure, pulse, and                            oxygen saturations were monitored continuously. The                            [id number]-HQ190L ([id number]) scope was introduced through                            the anus and advanced to the the cecum, identified                            by appendiceal orifice and ileocecal valve. The                            colonoscopy was performed without difficulty. The                            patient tolerated the procedure well. The  quality                            of the bowel preparation was evaluated using the                            BBPS [facility] Bowel Preparation Scale) with scores                            of: Right Colon = 3, Transverse Colon = 3 and Left                            Colon = 3 (entire mucosa seen well with no residual                            staining,  small fragments of stool or opaque                            liquid). The total BBPS score equals 9. Scope In: 12:48:57 PM Scope Out: 1:02:44 PM Scope Withdrawal Time: 0 hours 11 minutes 36 seconds  Total Procedure Duration: 0 hours 13 minutes 47 seconds  Findings:      The perianal and digital rectal examinations were normal.      Non-bleeding internal hemorrhoids were found during endoscopy.      A few small-mouthed diverticula were found in the sigmoid colon.      Three sessile polyps were found in the rectum, descending colon and       transverse colon. The polyps were 4 to 7 mm in size. These polyps were       removed with a cold snare. Resection and retrieval were complete. Impression:               - Non-bleeding internal hemorrhoids.                           - Diverticulosis in the sigmoid colon.                           - Three 4 to 7 mm polyps in the rectum, in the                            descending colon and in the transverse colon,                            removed with a cold snare. Resected and retrieved. Moderate Sedation:      Per Anesthesia Care Recommendation:           - Patient has a contact number available for                            emergencies. The signs and symptoms of potential                            delayed complications were discussed with the  patient. Return to normal activities tomorrow.                            Written discharge instructions were provided to the                            patient.                           - Resume previous diet.                           - Continue present medications.                           - Await pathology results.                           - Repeat colonoscopy in 5 years for surveillance.                           - Return to GI clinic PRN. Procedure Code(s):        --- Professional ---                           [id number], Colonoscopy, flexible; with removal of                             tumor(s), polyp(s), or other lesion(s) by snare                            technique Diagnosis Code(s):        --- Professional ---                           Z12.11, Encounter for screening for malignant                            neoplasm of colon                           K64.8, Other hemorrhoids                           K62.1, Rectal polyp                           K63.5, Polyp of colon                           K57.30, Diverticulosis of large intestine without                            perforation or abscess without bleeding CPT copyright 2019 American Medical Association. All rights reserved. The codes documented in this report are preliminary and upon coder review may  be revised to meet current compliance requirements. Elon Alas. Abbey Chatters, DO Zebulon Gary, DO 06/22/2021 1:05:07 PM This report  has been signed electronically. Number of Addenda: 0

## 2021-06-22 NOTE — Discharge Instructions (Addendum)
  Colonoscopy Discharge Instructions  Read the instructions outlined below and refer to this sheet in the next few weeks. These discharge instructions provide you with general information on caring for yourself after you leave the hospital. Your doctor may also give you specific instructions. While your treatment has been planned according to the most current medical practices available, unavoidable complications occasionally occur.   ACTIVITY You may resume your regular activity, but move at a slower pace for the next 24 hours.  Take frequent rest periods for the next 24 hours.  Walking will help get rid of the air and reduce the bloated feeling in your belly (abdomen).  No driving for 24 hours (because of the medicine (anesthesia) used during the test).   Do not sign any important legal documents or operate any machinery for 24 hours (because of the anesthesia used during the test).  NUTRITION Drink plenty of fluids.  You may resume your normal diet as instructed by your doctor.  Begin with a light meal and progress to your normal diet. Heavy or fried foods are harder to digest and may make you feel sick to your stomach (nauseated).  Avoid alcoholic beverages for 24 hours or as instructed.  MEDICATIONS You may resume your normal medications unless your doctor tells you otherwise.  WHAT YOU CAN EXPECT TODAY Some feelings of bloating in the abdomen.  Passage of more gas than usual.  Spotting of blood in your stool or on the toilet paper.  IF YOU HAD POLYPS REMOVED DURING THE COLONOSCOPY: No aspirin products for 7 days or as instructed.  No alcohol for 7 days or as instructed.  Eat a soft diet for the next 24 hours.  FINDING OUT THE RESULTS OF YOUR TEST Not all test results are available during your visit. If your test results are not back during the visit, make an appointment with your caregiver to find out the results. Do not assume everything is normal if you have not heard from your  caregiver or the medical facility. It is important for you to follow up on all of your test results.  SEEK IMMEDIATE MEDICAL ATTENTION IF: You have more than a spotting of blood in your stool.  Your belly is swollen (abdominal distention).  You are nauseated or vomiting.  You have a temperature over 101.  You have abdominal pain or discomfort that is severe or gets worse throughout the day.   Your colonoscopy revealed 3 polyp(s) which I removed successfully. Await pathology results, my office will contact you. I recommend repeating colonoscopy in 5 years for surveillance purposes.   Otherwise follow up with GI as needed.    I hope you have a great rest of your week!  Charles K. Carver, D.O. Gastroenterology and Hepatology [facility]  

## 2021-06-22 NOTE — Transfer of Care (Signed)
Immediate Anesthesia Transfer of Care Note  Patient: [name]  Procedure(s) Performed: COLONOSCOPY WITH PROPOFOL POLYPECTOMY  Patient Location: PACU  Anesthesia Type:General  Level of Consciousness: awake, alert , oriented and patient cooperative  Airway & Oxygen Therapy: Patient Spontanous Breathing  Post-op Assessment: Report given to RN, Post -op Vital signs reviewed and stable and Patient moving all extremities X 4  Post vital signs: Reviewed and stable  Last Vitals:  Vitals Value Taken Time  BP    Temp    Pulse    Resp    SpO2      Last Pain:  Vitals:   06/22/21 1201  TempSrc: Oral  PainSc: 0-No pain         Complications: No notable events documented.

## 2021-06-22 NOTE — Anesthesia Postprocedure Evaluation (Signed)
Anesthesia Post Note  Patient: [name]  Procedure(s) Performed: COLONOSCOPY WITH PROPOFOL POLYPECTOMY  Patient location during evaluation: Endoscopy Anesthesia Type: General Anesthetic complications: no Comments: Nursing staff discharged patient as per protocol.   No notable events documented.   Last Vitals:  Vitals:   06/22/21 1201 06/22/21 1305  BP: (!) 143/90 (!) 93/48  Pulse: 70   Resp: 12 10  Temp: 36.7 C 36.4 C  SpO2: 100% 99%    Last Pain:  Vitals:   06/22/21 1305  TempSrc: Axillary  PainSc: 0-No pain                 Keeli Roberg C Mannie Wineland

## 2021-06-22 NOTE — Anesthesia Preprocedure Evaluation (Addendum)
Anesthesia Evaluation  Patient identified by MRN, date of birth, ID band Patient awake    Reviewed: Allergy & Precautions, NPO status , Patient's Chart, lab work & pertinent test results  History of Anesthesia Complications Negative for: history of anesthetic complications  Airway Mallampati: II  TM Distance: >3 FB Neck ROM: Full    Dental  (+) Dental Advisory Given, Missing   Pulmonary sleep apnea , former smoker,    Pulmonary exam normal breath sounds clear to auscultation       Cardiovascular Exercise Tolerance: Good hypertension, Pt. on medications Normal cardiovascular exam Rhythm:Regular Rate:Normal     Neuro/Psych negative neurological ROS  negative psych ROS   GI/Hepatic negative GI ROS, Neg liver ROS,   Endo/Other  diabetes, Well Controlled, Type 2, Oral Hypoglycemic Agents  Renal/GU negative Renal ROS     Musculoskeletal negative musculoskeletal ROS (+)   Abdominal   Peds  Hematology  (+) anemia ,   Anesthesia Other Findings   Reproductive/Obstetrics negative OB ROS                            Anesthesia Physical Anesthesia Plan  ASA: 2  Anesthesia Plan: General   Post-op Pain Management: Minimal or no pain anticipated   Induction:   PONV Risk Score and Plan: TIVA  Airway Management Planned: Nasal Cannula and Natural Airway  Additional Equipment:   Intra-op Plan:   Post-operative Plan:   Informed Consent: I have reviewed the patients History and Physical, chart, labs and discussed the procedure including the risks, benefits and alternatives for the proposed anesthesia with the patient or authorized representative who has indicated his/her understanding and acceptance.     Dental advisory given  Plan Discussed with: CRNA and Surgeon  Anesthesia Plan Comments:         Anesthesia Quick Evaluation

## 2021-06-23 ENCOUNTER — Ambulatory Visit: Payer: Medicare Other

## 2021-06-23 ENCOUNTER — Encounter (INDEPENDENT_AMBULATORY_CARE_PROVIDER_SITE_OTHER): Payer: Self-pay

## 2021-06-23 LAB — SURGICAL PATHOLOGY

## 2021-06-28 ENCOUNTER — Encounter (HOSPITAL_COMMUNITY): Payer: Self-pay | Admitting: Internal Medicine

## 2021-10-11 ENCOUNTER — Ambulatory Visit: Payer: Medicare Other | Admitting: Internal Medicine

## 2021-10-18 ENCOUNTER — Encounter: Payer: Self-pay | Admitting: Internal Medicine

## 2021-10-18 ENCOUNTER — Ambulatory Visit (INDEPENDENT_AMBULATORY_CARE_PROVIDER_SITE_OTHER): Payer: Medicare Other | Admitting: Internal Medicine

## 2021-10-18 ENCOUNTER — Other Ambulatory Visit: Payer: Self-pay

## 2021-10-18 VITALS — BP 128/82 | HR 73 | Resp 18 | Ht 66.0 in | Wt 223.2 lb

## 2021-10-18 DIAGNOSIS — H6123 Impacted cerumen, bilateral: Secondary | ICD-10-CM | POA: Diagnosis not present

## 2021-10-18 DIAGNOSIS — I10 Essential (primary) hypertension: Secondary | ICD-10-CM | POA: Diagnosis not present

## 2021-10-18 DIAGNOSIS — E559 Vitamin D deficiency, unspecified: Secondary | ICD-10-CM | POA: Diagnosis not present

## 2021-10-18 DIAGNOSIS — E782 Mixed hyperlipidemia: Secondary | ICD-10-CM | POA: Diagnosis not present

## 2021-10-18 DIAGNOSIS — Z0000 Encounter for general adult medical examination without abnormal findings: Secondary | ICD-10-CM | POA: Diagnosis not present

## 2021-10-18 DIAGNOSIS — L729 Follicular cyst of the skin and subcutaneous tissue, unspecified: Secondary | ICD-10-CM

## 2021-10-18 DIAGNOSIS — E1169 Type 2 diabetes mellitus with other specified complication: Secondary | ICD-10-CM | POA: Diagnosis not present

## 2021-10-18 DIAGNOSIS — D171 Benign lipomatous neoplasm of skin and subcutaneous tissue of trunk: Secondary | ICD-10-CM | POA: Diagnosis not present

## 2021-10-18 DIAGNOSIS — M5432 Sciatica, left side: Secondary | ICD-10-CM | POA: Diagnosis not present

## 2021-10-18 DIAGNOSIS — E119 Type 2 diabetes mellitus without complications: Secondary | ICD-10-CM | POA: Insufficient documentation

## 2021-10-18 LAB — POCT GLYCOSYLATED HEMOGLOBIN (HGB A1C): HbA1c, POC (controlled diabetic range): 7.3 % — AB (ref 0.0–7.0)

## 2021-10-18 NOTE — Patient Instructions (Addendum)
Please use Debrox ear drops for ear wax. Okay to use Qtip for cleaning purposes. Avoid using any sharp objects. ? ?Please continue to take medications as prescribed. ? ?Please use Voltaren gel for arthritic pain in back. ? ?Please avoid heavy lifting or frequent bending. ? ?You are being referred to General surgeon for evaluation of mass in pelvic area. ? ?Please get fasting blood tests done before the next visit. ?

## 2021-10-18 NOTE — Assessment & Plan Note (Signed)
Lab Results  ?Component Value Date  ? HGBA1C 6.9 (H) 06/10/2021  ? ? ?On Metformin 500 mg BID ?Advised to follow diabetic diet ?On statin and ACEi ?F/u CMP and lipid panel ?Diabetic foot exam: Today ?Diabetic eye exam: Advised to follow up with Ophthalmology for diabetic eye exam ?

## 2021-10-19 ENCOUNTER — Encounter: Payer: Self-pay | Admitting: General Surgery

## 2021-10-19 ENCOUNTER — Ambulatory Visit: Payer: Medicare Other | Admitting: General Surgery

## 2021-10-19 VITALS — BP 125/79 | HR 67 | Temp 98.4°F | Resp 14 | Ht 66.0 in | Wt 223.0 lb

## 2021-10-19 DIAGNOSIS — D171 Benign lipomatous neoplasm of skin and subcutaneous tissue of trunk: Secondary | ICD-10-CM | POA: Insufficient documentation

## 2021-10-19 NOTE — Progress Notes (Signed)
[facility] History and Physical ? ?Reason for Referral: Mass on left buttock  ?Referring Physician:  Dr. Posey Pronto ? ?Chief Complaint   ?New Patient (Initial Visit) ?  ? ? ?[name] is a 71 y.o. female.  ?HPI: Ms .Fleming is a 71 yo who has had a mass on the left buttock for sometime now and she feels like it is getting larger. She is also worried about it causing her pain and discomfort down the side of her leg. She has never been told she had any sciatica or anything prior. This area has never been infected or inflamed. She has had it for years. She is just worried about her leg pain and this being the cause. She follows with Dr. Posey Pronto and he sent her over for evaluation.  She has pain in the left buttock, hip, and running down the leg to the point she cannot place weight on the leg. She had osteopenia on the 2019 Bone density scan.  ? ? ?Past Medical History:  ?Diagnosis Date  ? Allergy   ? Anemia   ? Hyperlipidemia   ? Hypertension   ? Sleep apnea   ? Type 2 diabetes mellitus ([place])   ? ? ?Past Surgical History:  ?Procedure Laterality Date  ? ABDOMINAL HYSTERECTOMY    ? fibroids  ? COLONOSCOPY WITH PROPOFOL N/A 06/22/2021  ? Procedure: COLONOSCOPY WITH PROPOFOL;  Surgeon: Eloise Harman, DO;  [facility]: [facility];  Service: Endoscopy;  Laterality: N/A;  1:00 / ASA II  ? fatty tumor    ? removed  ? POLYPECTOMY  06/22/2021  ? Procedure: POLYPECTOMY;  Surgeon: Eloise Harman, DO;  Location: [facility];  Service: Endoscopy;;  ? ? ?Family History  ?Problem Relation Age of Onset  ? Alzheimer's disease Mother   ? Cancer Mother   ?     unknown  ? Alcohol abuse Father   ? COPD Father   ? Diabetes Sister   ? Hypertension Sister   ? Huntington's disease Daughter   ? ? ?Social History  ? ?Tobacco Use  ? Smoking status: Former  ?  Years: 2.00  ?  Types: Cigarettes  ?  Start date: 08/02/1971  ?  Quit date: 08/01/1986  ?  Years since quitting: 35.2  ? Smokeless tobacco: Never  ?Vaping Use  ?  Vaping Use: Never used  ?Substance Use Topics  ? Alcohol use: No  ? Drug use: No  ? ? ?Medications: I have reviewed the patient's current medications. ?Allergies as of 10/19/2021   ? ?   Reactions  ? Sulfonamide Derivatives Other (See Comments)  ? Does not recall reaction   ? ?  ? ?  ?Medication List  ?  ? ?  ? Accurate as of October 19, 2021 10:38 AM. If you have any questions, ask your nurse or doctor.  ?  ?  ? ?  ? ?aspirin 81 MG tablet ?Take 81 mg by mouth daily. ?  ?chlorpheniramine 4 MG tablet ?Commonly known as: CHLOR-TRIMETON ?Take 4 mg by mouth daily. ?  ?enalapril 5 MG tablet ?Commonly known as: VASOTEC ?Take 1 tablet (5 mg total) by mouth daily. ?  ?Fish Oil 1000 MG Caps ?Take 2,000 mg by mouth 2 (two) times daily. ?  ?fluticasone 50 MCG/ACT nasal spray ?Commonly known as: FLONASE ?Place 2 sprays into both nostrils daily. ?  ?ketoconazole 2 % cream ?Commonly known as: NIZORAL ?Apply 1 application topically daily. ?  ?metFORMIN 500 MG  tablet ?Commonly known as: GLUCOPHAGE ?Take 1 tablet (500 mg total) by mouth 2 (two) times daily with a meal. ?  ?MULTIVITAMINS PO ?Take 1 tablet by mouth daily. ?  ?mupirocin nasal ointment 2 % ?Commonly known as: BACTROBAN ?Place 1 application into the nose 2 (two) times daily. Use one-half of tube in each nostril twice daily for five (5) days. After application, press sides of nose together and gently massage. ?  ?simvastatin 40 MG tablet ?Commonly known as: ZOCOR ?Take 1 tablet (40 mg total) by mouth daily at 6 PM. ?  ?Vitamin D3 50 MCG (2000 UT) Tabs ?Take 2,000 Units by mouth daily. ?  ? ?  ? ? ? ?ROS:  ?A comprehensive review of systems was negative except for: Cardiovascular: positive for chest pain and HTN ?Musculoskeletal: positive for back pain and left hip and buttock pain, pain down the leg and in her joints ? ?Blood pressure 125/79, pulse 67, temperature 98.4 ?F (36.9 ?C), temperature source Other (Comment), resp. rate 14, height '5\' 6"'$  (1.676 m), weight 223 lb  (101.2 kg), SpO2 98 %. ?Physical Exam ?Vitals reviewed.  ?Constitutional:   ?   Appearance: She is obese.  ?HENT:  ?   Head: Normocephalic.  ?   Mouth/Throat:  ?   Mouth: Mucous membranes are moist.  ?Eyes:  ?   Pupils: Pupils are equal, round, and reactive to light.  ?Cardiovascular:  ?   Rate and Rhythm: Normal rate and regular rhythm.  ?Pulmonary:  ?   Effort: Pulmonary effort is normal.  ?   Breath sounds: Normal breath sounds.  ?Abdominal:  ?   General: There is no distension.  ?   Palpations: Abdomen is soft.  ?Genitourinary: ?   Comments: Left buttock upper portion, 3cm mass superficial that is hard, no signs of central pit or swelling ?Musculoskeletal:     ?   General: Swelling present.  ?   Cervical back: Normal range of motion.  ?Skin: ?   General: Skin is warm.  ?Neurological:  ?   General: No focal deficit present.  ?   Mental Status: She is alert.  ?Psychiatric:     ?   Mood and Affect: Mood normal.  ? ? ?Results: ?Results for orders placed or performed in visit on 10/18/21 (from the past 48 hour(s))  ?POCT glycosylated hemoglobin (Hb A1C)     Status: Abnormal  ? Collection Time: 10/18/21 10:46 AM  ?Result Value Ref Range  ? Hemoglobin A1C    ? HbA1c POC (<> result, manual entry)    ? HbA1c, POC (prediabetic range)    ? HbA1c, POC (controlled diabetic range) 7.3 (A) 0.0 - 7.0 %  ? ? ?None ? ?Assessment & Plan:  ?[name] is a 71 y.o. female with what is likely a lipoma or a cyst on the left buttock. I do not think this is causing her hip and buttock pain or the pain down her leg. Discussed that this is superficial and does not typically cause pain like she is describing. Discussed potential for sciatica and that something else could be causing this pain.  ? ?-Will get an [place] to assess the lesion further ?-Do not think removing it will reduce or relief her pain ?-Will call with [place] results  ?-Information on sciatica given so she can review and see if this sounds like her issue ?  ?Future  Appointments  ?Date Time Provider [place]  ?10/22/2021 12:30 PM [facility] [place]  [facility]  ?11/22/2021  1:00 PM [facility] NURSE [facility]  ?04/20/2022 11:00 AM Lindell Spar, MD [facility]  ? ? ?All questions were answered to the satisfaction of the patient. ? ? ?Virl Cagey ?10/19/2021, 10:38 AM  ? ? ? ?  ?

## 2021-10-19 NOTE — Patient Instructions (Signed)
Ultrasound ordered, someone will call you with the time and we will call you with the results once it is completed.  ? ?Sciatica ?Sciatica is pain, numbness, weakness, or tingling along the path of the sciatic nerve. The sciatic nerve starts in the lower back and runs down the back of each leg. The nerve controls the muscles in the lower leg and in the back of the knee. It also provides feeling (sensation) to the back of the thigh, the lower leg, and the sole of the foot. Sciatica is a symptom of another medical condition that pinches or puts pressure on the sciatic nerve. ?Sciatica most often only affects one side of the body. Sciatica usually goes away on its own or with treatment. In some cases, sciatica may come back (recur). ?What are the causes? ?This condition is caused by pressure on the sciatic nerve or pinching of the nerve. This may be the result of: ?A disk in between the bones of the spine bulging out too far (herniated disk). ?Age-related changes in the spinal disks. ?A pain disorder that affects a muscle in the buttock. ?Extra bone growth near the sciatic nerve. ?A break (fracture) of the pelvis. ?Pregnancy. ?Tumor. This is rare. ?What increases the risk? ?The following factors may make you more likely to develop this condition: ?Playing sports that place pressure or stress on the spine. ?Having poor strength and flexibility. ?A history of back injury or surgery. ?Sitting for long periods of time. ?Doing activities that involve repetitive bending or lifting. ?Obesity. ?What are the signs or symptoms? ?Symptoms can vary from mild to very severe, and they may include: ?Any of these problems in the lower back, leg, hip, or buttock: ?Mild tingling, numbness, or dull aches. ?Burning sensations. ?Sharp pains. ?Numbness in the back of the calf or the sole of the foot. ?Leg weakness. ?Severe back pain that makes movement difficult. ?Symptoms may get worse when you cough, sneeze, or laugh, or when you sit or  stand for long periods of time. ?How is this diagnosed? ?This condition may be diagnosed based on: ?Your symptoms and medical history. ?A physical exam. ?Blood tests. ?Imaging tests, such as: ?X-rays. ?MRI. ?CT scan. ?How is this treated? ?In many cases, this condition improves on its own without treatment. However, treatment may include: ?Reducing or modifying physical activity. ?Exercising and stretching. ?Icing and applying heat to the affected area. ?Medicines that help to: ?Relieve pain and swelling. ?Relax your muscles. ?Injections of medicines that help to relieve pain, irritation, and inflammation around the sciatic nerve (steroids). ?Surgery. ?Follow these instructions at home: ?Medicines ?Take over-the-counter and prescription medicines only as told by your health care provider. ?Ask your health care provider if the medicine prescribed to you: ?Requires you to avoid driving or using heavy machinery. ?Can cause constipation. You may need to take these actions to prevent or treat constipation: ?Drink enough fluid to keep your urine pale yellow. ?Take over-the-counter or prescription medicines. ?Eat foods that are high in fiber, such as beans, whole grains, and fresh fruits and vegetables. ?Limit foods that are high in fat and processed sugars, such as fried or sweet foods. ?Managing pain ?  ?If directed, put ice on the affected area. ?Put ice in a plastic bag. ?Place a towel between your skin and the bag. ?Leave the ice on for 20 minutes, 2-3 times a day. ?If directed, apply heat to the affected area. Use the heat source that your health care provider recommends, such as a moist heat  pack or a heating pad. ?Place a towel between your skin and the heat source. ?Leave the heat on for 20-30 minutes. ?Remove the heat if your skin turns bright red. This is especially important if you are unable to feel pain, heat, or cold. You may have a greater risk of getting burned. ?Activity ? ?Return to your normal  activities as told by your health care provider. Ask your health care provider what activities are safe for you. ?Avoid activities that make your symptoms worse. ?Take brief periods of rest throughout the day. ?When you rest for longer periods, mix in some mild activity or stretching between periods of rest. This will help to prevent stiffness and pain. ?Avoid sitting for long periods of time without moving. Get up and move around at least one time each hour. ?Exercise and stretch regularly, as told by your health care provider. ?Do not lift anything that is heavier than 10 lb (4.5 kg) while you have symptoms of sciatica. When you do not have symptoms, you should still avoid heavy lifting, especially repetitive heavy lifting. ?When you lift objects, always use proper lifting technique, which includes: ?Bending your knees. ?Keeping the load close to your body. ?Avoiding twisting. ?General instructions ?Maintain a healthy weight. Excess weight puts extra stress on your back. ?Wear supportive, comfortable shoes. Avoid wearing high heels. ?Avoid sleeping on a mattress that is too soft or too hard. A mattress that is firm enough to support your back when you sleep may help to reduce your pain. ?Keep all follow-up visits as told by your health care provider. This is important. ?Contact a health care provider if: ?You have pain that: ?Wakes you up when you are sleeping. ?Gets worse when you lie down. ?Is worse than you have experienced in the past. ?Lasts longer than 4 weeks. ?You have an unexplained weight loss. ?Get help right away if: ?You are not able to control when you urinate or have bowel movements (incontinence). ?You have: ?Weakness in your lower back, pelvis, buttocks, or legs that gets worse. ?Redness or swelling of your back. ?A burning sensation when you urinate. ?Summary ?Sciatica is pain, numbness, weakness, or tingling along the path of the sciatic nerve. ?This condition is caused by pressure on the  sciatic nerve or pinching of the nerve. ?Sciatica can cause pain, numbness, or tingling in the lower back, legs, hips, and buttocks. ?Treatment often includes rest, exercise, medicines, and applying ice or heat. ?This information is not intended to replace advice given to you by your health care provider. Make sure you discuss any questions you have with your health care provider. ?Document Revised: 08/06/2018 Document Reviewed: 08/06/2018 ?Elsevier Patient Education ? [place]. ? ?

## 2021-10-22 ENCOUNTER — Other Ambulatory Visit: Payer: Self-pay

## 2021-10-22 ENCOUNTER — Ambulatory Visit (HOSPITAL_COMMUNITY)
Admission: RE | Admit: 2021-10-22 | Discharge: 2021-10-22 | Disposition: A | Discharge status: Home / Self Care | Payer: Medicare Other | Source: Ambulatory Visit | Attending: General Surgery | Admitting: General Surgery

## 2021-10-22 DIAGNOSIS — D171 Benign lipomatous neoplasm of skin and subcutaneous tissue of trunk: Secondary | ICD-10-CM | POA: Insufficient documentation

## 2021-10-22 DIAGNOSIS — H6123 Impacted cerumen, bilateral: Secondary | ICD-10-CM | POA: Insufficient documentation

## 2021-10-22 DIAGNOSIS — M5432 Sciatica, left side: Secondary | ICD-10-CM | POA: Insufficient documentation

## 2021-10-22 DIAGNOSIS — L0233 Carbuncle of buttock: Secondary | ICD-10-CM | POA: Diagnosis not present

## 2021-10-22 NOTE — Assessment & Plan Note (Signed)
On statin.

## 2021-10-22 NOTE — Assessment & Plan Note (Signed)
Hard mass over left buttock area ?Unlikely contributing to her hip or leg pain ?Referred to general surgery ?

## 2021-10-22 NOTE — Assessment & Plan Note (Signed)
Heart left hip pain could be due to arthritis ?Radiating pain to the left leg likely due to sciatica ?Advised to avoid heavy lifting and frequent bending ?Tylenol as needed for pain ?Voltaren gel as needed for arthritic pain ?If persistent pain, will get imaging and/or orthopedic surgery referral ?

## 2021-10-22 NOTE — Progress Notes (Signed)
? ?Established Patient Office Visit ? ?Subjective:  ?Patient ID: [name], female    DOB: 1951/05/19  Age: 71 y.o. MRN: [id number] ? ?CC:  ?Chief Complaint  ?Patient presents with  ? Follow-up  ?  4 month follow up left leg and hip hurting running down the leg knot on back side also left ear feels like something is in it has been this way for months   ? ? ?HPI ?[name] is a 71 y.o. female with past medical history of HTN, allergic sinusitis, type II DM, osteopenia, HLD and obesity who presents for f/u of her chronic medical conditions. ? ?She complains of left hip pain, radiating to her left leg.  She also has noticed a lump in her buttock area, which has been chronic.  She feels as the lump is growing and is contributing to her pain.  She denies any numbness, but reports weakness of the LLE.  Denies any recent injury or fall. ? ?She complains of bilateral ear fullness, but denies any ear pain or discharge currently. ? ?HTN: BP is well-controlled. Takes medications regularly. Patient denies headache, dizziness, chest pain or palpitations. ?  ?Type II DM with HLD: She takes metformin for it.  Her last HbA1c was 7.2.  She denies any polyuria or polydipsia.  She takes Zocor for HLD. ? ? ? ?Past Medical History:  ?Diagnosis Date  ? Allergy   ? Anemia   ? Hyperlipidemia   ? Hypertension   ? Sleep apnea   ? Type 2 diabetes mellitus ([place])   ? ? ?Past Surgical History:  ?Procedure Laterality Date  ? ABDOMINAL HYSTERECTOMY    ? fibroids  ? COLONOSCOPY WITH PROPOFOL N/A 06/22/2021  ? Procedure: COLONOSCOPY WITH PROPOFOL;  Surgeon: Eloise Harman, DO;  [facility]: [facility];  Service: Endoscopy;  Laterality: N/A;  1:00 / ASA II  ? fatty tumor    ? removed  ? POLYPECTOMY  06/22/2021  ? Procedure: POLYPECTOMY;  Surgeon: Eloise Harman, DO;  Location: [facility];  Service: Endoscopy;;  ? ? ?Family History  ?Problem Relation Age of Onset  ? Alzheimer's disease Mother   ? Cancer Mother   ?      unknown  ? Alcohol abuse Father   ? COPD Father   ? Diabetes Sister   ? Hypertension Sister   ? Huntington's disease Daughter   ? ? ?Social History  ? ?Socioeconomic History  ? Marital status: Divorced  ?  Spouse name: Not on file  ? Number of children: 2  ? Years of education: Not on file  ? Highest education level: 11th grade  ?Occupational History  ? 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 Smoking status: Former  ?  Years: 2.00  ?  Types: Cigarettes  ?  Start date: 08/02/1971  ?  Quit date: 08/01/1986  ?  Years since quitting: 35.2  ? Smokeless tobacco: Never  ?Vaping Use  ? Vaping Use: Never used  ?Substance and Sexual Activity  ? Alcohol use: No  ? Drug use: No  ? Sexual activity: Never  ?  Birth control/protection: Surgical  ?Other Topics Concern  ? Not on file  ?Social History Narrative  ? Lives with disabled mother with Alzheimer's  ? Daughter with [place]'s passed away 09/15/19.  ? 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  ? ?Social Determinants of Health  ? ?Financial Resource Strain: Not on file  ?  Food Insecurity: Not on file  ?Transportation Needs: Not on file  ?Physical Activity: Not on file  ?Stress: Not on file  ?Social Connections: Not on file  ?Intimate Partner Violence: Not on file  ? ? ?Outpatient Medications Prior to Visit  ?Medication Sig Dispense Refill  ? aspirin 81 MG tablet Take 81 mg by mouth daily.    ? chlorpheniramine (CHLOR-TRIMETON) 4 MG tablet Take 4 mg by mouth daily. (Patient not taking: Reported on 10/19/2021)    ? Cholecalciferol (VITAMIN D3) 50 MCG (2000 UT) TABS Take 2,000 Units by mouth daily.    ? enalapril (VASOTEC) 5 MG tablet Take 1 tablet (5 mg total) by mouth daily. 90 tablet 1  ? fluticasone (FLONASE) 50 MCG/ACT nasal spray Place 2 sprays into both nostrils daily. 16 g 6  ? ketoconazole (NIZORAL) 2 % cream Apply 1 application topically daily. 15 g 0  ? metFORMIN (GLUCOPHAGE) 500 MG tablet Take 1  tablet (500 mg total) by mouth 2 (two) times daily with a meal. 180 tablet 1  ? Multiple Vitamin (MULTIVITAMINS PO) Take 1 tablet by mouth daily.    ? mupirocin nasal ointment (BACTROBAN) 2 % Place 1 application into the nose 2 (two) times daily. Use one-half of tube in each nostril twice daily for five (5) days. After application, press sides of nose together and gently massage. 10 g 0  ? Omega-3 Fatty Acids (FISH OIL) 1000 MG CAPS Take 2,000 mg by mouth 2 (two) times daily.    ? simvastatin (ZOCOR) 40 MG tablet Take 1 tablet (40 mg total) by mouth daily at 6 PM. 90 tablet 1  ? ?No facility-administered medications prior to visit.  ? ? ?Allergies  ?Allergen Reactions  ? Sulfonamide Derivatives Other (See Comments)  ?  Does not recall reaction   ? ? ?ROS ?Review of Systems  ?Constitutional:  Negative for chills and fever.  ?HENT:  Negative for congestion, sinus pressure, sinus pain and sore throat.   ?Eyes:  Negative for pain and discharge.  ?Respiratory:  Negative for cough and wheezing.   ?Cardiovascular:  Negative for chest pain and palpitations.  ?Gastrointestinal:  Negative for abdominal pain, diarrhea, nausea and vomiting.  ?Endocrine: Negative for polydipsia and polyuria.  ?Genitourinary:  Negative for dysuria and hematuria.  ?Musculoskeletal:  Negative for neck pain and neck stiffness.  ?     Left hip and leg pain  ?Skin:   ?     Mole over abdominal wall  ?Neurological:  Negative for dizziness and weakness.  ?Psychiatric/Behavioral:  Negative for agitation and behavioral problems.   ? ?  ?Objective:  ?  ?Physical Exam ?Vitals reviewed.  ?Constitutional:   ?   General: She is not in acute distress. ?   Appearance: She is not diaphoretic.  ?HENT:  ?   Head: Normocephalic and atraumatic.  ?   Ears:  ?   Comments: Excess earwax bilaterally ?   Nose: Nose normal.  ?   Mouth/Throat:  ?   Mouth: Mucous membranes are moist.  ?Eyes:  ?   General: No scleral icterus. ?   Extraocular Movements: Extraocular movements  intact.  ?Cardiovascular:  ?   Rate and Rhythm: Normal rate and regular rhythm.  ?   Pulses: Normal pulses.  ?   Heart sounds: Normal heart sounds. No murmur heard. ?Pulmonary:  ?   Breath sounds: Normal breath sounds. No wheezing or rales.  ?Abdominal:  ?   Palpations: Abdomen is soft.  ?     Tenderness: There is no abdominal tenderness.  ?Musculoskeletal:  ?   Cervical back: Neck supple. No tenderness.  ?   Right lower leg: No edema.  ?   Left lower leg: No edema.  ?Skin: ?   General: Skin is warm.  ?   Comments: Round mass noted over left buttock, about 2 cm in diameter ?2 brownish moles with clear margin on left side of abdominal wall  ?Neurological:  ?   General: No focal deficit present.  ?   Mental Status: She is alert and oriented to person, place, and time.  ?Psychiatric:     ?   Mood and Affect: Mood normal.     ?   Behavior: Behavior normal.  ? ? ?BP 128/82 (BP Location: Left Arm, Patient Position: Sitting, Cuff Size: Normal)   Pulse 73   Resp 18   Ht 5' 6" (1.676 m)   Wt 223 lb 3.2 oz (101.2 kg)   SpO2 97%   BMI 36.03 kg/m?  ?Wt Readings from Last 3 Encounters:  ?10/19/21 223 lb (101.2 kg)  ?10/18/21 223 lb 3.2 oz (101.2 kg)  ?06/10/21 223 lb 1.9 oz (101.2 kg)  ? ? ?Lab Results  ?Component Value Date  ? TSH 1.850 06/10/2021  ? ?Lab Results  ?Component Value Date  ? WBC 5.0 10/14/2019  ? HGB 12.3 10/14/2019  ? HCT 38.5 10/14/2019  ? MCV 83.9 10/14/2019  ? PLT 306 10/14/2019  ? ?Lab Results  ?Component Value Date  ? NA 143 06/10/2021  ? K 4.8 06/10/2021  ? CO2 28 06/10/2021  ? GLUCOSE 123 (H) 06/10/2021  ? BUN 9 06/10/2021  ? CREATININE 0.82 06/10/2021  ? BILITOT <0.2 06/10/2021  ? ALKPHOS 64 06/10/2021  ? AST 19 06/10/2021  ? ALT 18 06/10/2021  ? PROT 7.4 06/10/2021  ? ALBUMIN 4.4 06/10/2021  ? CALCIUM 10.0 06/10/2021  ? ANIONGAP 7 01/16/2017  ? EGFR 77 06/10/2021  ? ?Lab Results  ?Component Value Date  ? CHOL 126 02/11/2021  ? ?Lab Results  ?Component Value Date  ? HDL 32 (L) 02/11/2021  ? ?Lab  Results  ?Component Value Date  ? LDLCALC 70 02/11/2021  ? ?Lab Results  ?Component Value Date  ? TRIG 159 (H) 02/11/2021  ? ?Lab Results  ?Component Value Date  ? CHOLHDL 3.9 02/11/2021  ? ?Lab Results  ?Component Value

## 2021-10-22 NOTE — Assessment & Plan Note (Signed)
BP Readings from Last 1 Encounters:  ?10/19/21 125/79  ? ?Well-controlled with enalapril ?Counseled for compliance with the medications ?Advised DASH diet and moderate exercise/walking, at least 150 mins/week ?

## 2021-10-25 NOTE — Progress Notes (Signed)
Let patient know she has what is most likely a lipoma. We can try to remove this but I cannot say that it will change her leg pain.

## 2021-11-10 ENCOUNTER — Encounter (INDEPENDENT_AMBULATORY_CARE_PROVIDER_SITE_OTHER): Payer: Medicare Other | Admitting: Nurse Practitioner

## 2021-11-22 ENCOUNTER — Ambulatory Visit (INDEPENDENT_AMBULATORY_CARE_PROVIDER_SITE_OTHER): Payer: Medicare Other

## 2021-11-22 VITALS — BP 116/70 | HR 83 | Ht 66.0 in | Wt 220.0 lb

## 2021-11-22 DIAGNOSIS — Z0000 Encounter for general adult medical examination without abnormal findings: Secondary | ICD-10-CM

## 2021-11-22 NOTE — Patient Instructions (Signed)
?  [name] , ?Thank you for taking time to come for your Medicare Wellness Visit. I appreciate your ongoing commitment to your health goals. Please review the following plan we discussed and let me know if I can assist you in the future.  ? ?These are the goals we discussed: ? Goals   ? ?  HEMOGLOBIN A1C < 7.0   ?  Patient Stated   ?  Patient states that she currently does not have any goals set. ?  ?  Weight (lb) < 200 lb (90.7 kg)   ? ?  ?  ?This is a list of the screening recommended for you and due dates:  ?Health Maintenance  ?Topic Date Due  ? Zoster (Shingles) Vaccine (1 of 2) Never done  ? Pneumonia Vaccine (3) 08/03/2018  ? COVID-19 Vaccine (4 - Booster for Pfizer series) 08/06/2020  ? DEXA scan (bone density measurement)  08/23/2020  ? Eye exam for diabetics  09/19/2020  ? Flu Shot  03/01/2022  ? Hemoglobin A1C  04/20/2022  ? Complete foot exam   06/10/2022  ? Mammogram  11/19/2022  ? Tetanus Vaccine  05/28/2029  ? Colon Cancer Screening  06/23/2031  ? Hepatitis C Screening: USPSTF Recommendation to screen - Ages 2-79 yo.  Completed  ? HPV Vaccine  Aged Out  ?  ?

## 2021-11-22 NOTE — Progress Notes (Signed)
? ?Subjective:  ? [name] is a 71 y.o. female who presents for Medicare Annual (Subsequent) preventive examination. ? ?Review of Systems    ?Nutrition Risk Assessment: ? ?Has the patient had any N/V/D within the last 2 months?  No  ?Does the patient have any non-healing wounds?  No  ?Has the patient had any unintentional weight loss or weight gain?  No  ? ?Diabetes: ? ?Is the patient diabetic?  Yes  ?If diabetic, was a CBG obtained today?  No  ?Did the patient bring in their glucometer from home?  No  ?How often do you monitor your CBG's? Once a day.  ? ?Financial Strains and Diabetes Management: ? ?Are you having any financial strains with the device, your supplies or your medication? No .  ?Does the patient want to be seen by Chronic Care Management for management of their diabetes?  No  ?Would the patient like to be referred to a Nutritionist or for Diabetic Management?  No  ? ?Diabetic Exams: ? ?Diabetic Eye Exam: Overdue for diabetic eye exam. Pt has been advised about the importance in completing this exam. Patient advised to call and schedule an eye exam. ?Diabetic Foot Exam: Completed 06/10/21   ?Cardiac Risk Factors include: advanced age (>105mn, >>16women);diabetes mellitus;hypertension;dyslipidemia ? ?   ?Objective:  ?  ?Today's Vitals  ? 11/22/21 1309  ?BP: 116/70  ?Pulse: 83  ?SpO2: 96%  ?Weight: 220 lb (99.8 kg)  ?Height: '5\' 6"'$  (1.676 m)  ?PainSc: 0-No pain  ? ?Body mass index is 35.51 kg/m?. ? ? ?  11/22/2021  ?  1:17 PM 06/22/2021  ? 11:54 AM 11/05/2020  ?  1:17 PM 10/14/2019  ? 10:18 AM  ?Advanced Directives  ?Does Patient Have a Medical Advance Directive? No No No No  ?Would patient like information on creating a medical advance directive? No - Patient declined No - Patient declined No - Patient declined No - Patient declined  ? ? ?Current Medications (verified) ?Outpatient Encounter Medications as of 11/22/2021  ?Medication Sig  ? aspirin 81 MG tablet Take 81 mg by mouth daily.  ?  Cholecalciferol (VITAMIN D3) 50 MCG (2000 UT) TABS Take 2,000 Units by mouth daily.  ? enalapril (VASOTEC) 5 MG tablet Take 1 tablet (5 mg total) by mouth daily.  ? metFORMIN (GLUCOPHAGE) 500 MG tablet Take 1 tablet (500 mg total) by mouth 2 (two) times daily with a meal.  ? Multiple Vitamin (MULTIVITAMINS PO) Take 1 tablet by mouth daily.  ? Omega-3 Fatty Acids (FISH OIL) 1000 MG CAPS Take 2,000 mg by mouth 2 (two) times daily.  ? ONETOUCH ULTRA test strip USE 1 STRIP TO CHECK GLUCOSE ONCE DAILY  ? simvastatin (ZOCOR) 40 MG tablet Take 1 tablet (40 mg total) by mouth daily at 6 PM.  ? chlorpheniramine (CHLOR-TRIMETON) 4 MG tablet Take 4 mg by mouth daily. (Patient not taking: Reported on 10/19/2021)  ? fluticasone (FLONASE) 50 MCG/ACT nasal spray Place 2 sprays into both nostrils daily. (Patient not taking: Reported on 11/22/2021)  ? ketoconazole (NIZORAL) 2 % cream Apply 1 application topically daily. (Patient not taking: Reported on 11/22/2021)  ? mupirocin nasal ointment (BACTROBAN) 2 % Place 1 application into the nose 2 (two) times daily. Use one-half of tube in each nostril twice daily for five (5) days. After application, press sides of nose together and gently massage. (Patient not taking: Reported on 11/22/2021)  ? ?No facility-administered encounter medications on file as of 11/22/2021.  ? ? ?  Allergies (verified) ?Sulfonamide derivatives  ? ?History: ?Past Medical History:  ?Diagnosis Date  ? Allergy   ? Anemia   ? Hyperlipidemia   ? Hypertension   ? Sleep apnea   ? Type 2 diabetes mellitus ([place])   ? ?Past Surgical History:  ?Procedure Laterality Date  ? ABDOMINAL HYSTERECTOMY    ? fibroids  ? COLONOSCOPY WITH PROPOFOL N/A 06/22/2021  ? Procedure: COLONOSCOPY WITH PROPOFOL;  Surgeon: Eloise Harman, DO;  [facility];  Service: Endoscopy;  Laterality: N/A;  1:00 / ASA II  ? fatty tumor    ? removed  ? POLYPECTOMY  06/22/2021  ? Procedure: POLYPECTOMY;  Surgeon: Eloise Harman, DO;  [facility];  Service: Endoscopy;;  ? ?Family History  ?Problem Relation Age of Onset  ? Alzheimer's disease Mother   ? Cancer Mother   ?     unknown  ? Alcohol abuse Father   ? COPD Father   ? Diabetes Sister   ? Hypertension Sister   ? Huntington's disease Daughter   ? ?Social History  ? ?Socioeconomic History  ? Marital status: Divorced  ?  Spouse name: Not on file  ? Number of children: 2  ? Years of education: Not on file  ? Highest education level: 11th grade  ?Occupational History  ? 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 Smoking status: Former  ?  Years: 2.00  ?  Types: Cigarettes  ?  Start date: 08/02/1971  ?  Quit date: 08/01/1986  ?  Years since quitting: 35.3  ? Smokeless tobacco: Never  ?Vaping Use  ? Vaping Use: Never used  ?Substance and Sexual Activity  ? Alcohol use: No  ? Drug use: No  ? Sexual activity: Never  ?  Birth control/protection: Surgical  ?Other Topics Concern  ? Not on file  ?Social History Narrative  ? Lives with disabled mother with Alzheimer's  ? Daughter with [place]'s passed away 2019-08-26.  ? 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  ? ?Social Determinants of Health  ? ?Financial Resource Strain: Not on file  ?Food Insecurity: Not on file  ?Transportation Needs: Not on file  ?Physical Activity: Not on file  ?Stress: Not on file  ?Social Connections: Not on file  ? ? ?Tobacco Counseling ?Counseling given: Not Answered ? ? ?Clinical Intake: ? ?  ? ?Pain Score: 0-No pain ? ?  ? ?Diabetes: Yes ?CBG done?: No ?Did pt. bring in CBG monitor from home?: No ? ?How often do you need to have someone help you when you read instructions, pamphlets, or other written materials from your doctor or pharmacy?: 5 - Always ? ?Diabetic?yes ? ?  ? ?  ? ? ?Activities of Daily Living ? ?  11/22/2021  ?  1:17 PM  ?In your present state of health, do you have any difficulty performing the following activities:  ?Hearing? 0   ?Vision? 1  ?Difficulty concentrating or making decisions? 1  ?Comment sometimes  ?Walking or climbing stairs? 0  ?Dressing or bathing? 0  ?Doing errands, shopping? 0  ?Preparing Food and eating ? N  ?Using the Toilet? N  ?In the past six months, have you accidently leaked urine? N  ?Do you have problems with loss of bowel control? N  ?Managing your Medications? N  ?Managing your Finances? N  ?Housekeeping or managing your Housekeeping? N  ? ? ?Patient Care Team: ?Lindell Spar,  MD as PCP - General (Internal Medicine) ?Satira Sark, MD as PCP - Cardiology (Cardiology) ?Rourk, Cristopher Estimable, MD as Consulting Physician (Gastroenterology) ? ?Indicate any recent Medical Services you may have received from other than [facility] providers in the past year (date may be approximate). ? ?   ?Assessment:  ? This is a routine wellness examination for Marvina. ? ?Hearing/Vision screen ?No results found. ? ?Dietary issues and exercise activities discussed: ?Current Exercise Habits: The patient does not participate in regular exercise at present, Exercise limited by: orthopedic condition(s) ? ? Goals Addressed   ? ?  ?  ?  ?  ? This Visit's Progress  ?  Patient Stated     ?  Patient states that she currently does not have any goals set. ?  ? ?  ? ?Depression Screen ? ?  11/22/2021  ?  1:17 PM 10/18/2021  ?  9:33 AM 06/10/2021  ?  9:33 AM 11/05/2020  ?  1:10 PM 10/14/2019  ? 10:19 AM 08/03/2017  ?  2:28 PM  ?PHQ 2/9 Scores  ?PHQ - 2 Score 0 0 0 0 0 0  ?PHQ- 9 Score    0    ?Exception Documentation     Medical reason   ?  ?Fall Risk ? ?  11/22/2021  ?  1:17 PM 10/19/2021  ? 10:17 AM 10/18/2021  ?  9:33 AM 06/10/2021  ?  9:33 AM 11/05/2020  ?  1:10 PM  ?Fall Risk   ?Falls in the past year? 0 0 0 0 0  ?Number falls in past yr: 0  0 0   ?Injury with Fall? 0  0 0   ?Risk for fall due to : No Fall Risks  No Fall Risks No Fall Risks   ?Follow up Falls evaluation completed Falls evaluation completed Falls evaluation completed Falls evaluation  completed   ? ? ?FALL RISK PREVENTION PERTAINING TO THE HOME: ? ?Any stairs in or around the home? No  ?If so, are there any without handrails? No  ?Home free of loose throw rugs in walkways, pet beds, electrical cords, etc?

## 2021-12-21 ENCOUNTER — Other Ambulatory Visit: Payer: Self-pay | Admitting: *Deleted

## 2021-12-21 DIAGNOSIS — I10 Essential (primary) hypertension: Secondary | ICD-10-CM

## 2021-12-21 MED ORDER — ENALAPRIL MALEATE 5 MG PO TABS: 5.0000 mg | ORAL_TABLET | Freq: Every day | ORAL | 1 refills | Status: DC

## 2021-12-30 ENCOUNTER — Other Ambulatory Visit: Payer: Self-pay | Admitting: Internal Medicine

## 2021-12-30 DIAGNOSIS — E782 Mixed hyperlipidemia: Secondary | ICD-10-CM

## 2022-04-06 ENCOUNTER — Other Ambulatory Visit: Payer: Self-pay | Admitting: Internal Medicine

## 2022-04-06 DIAGNOSIS — E782 Mixed hyperlipidemia: Secondary | ICD-10-CM

## 2022-04-20 ENCOUNTER — Ambulatory Visit (INDEPENDENT_AMBULATORY_CARE_PROVIDER_SITE_OTHER): Payer: Medicare Other | Admitting: Internal Medicine

## 2022-04-20 ENCOUNTER — Encounter: Payer: Self-pay | Admitting: Internal Medicine

## 2022-04-20 VITALS — BP 128/86 | HR 82 | Resp 18 | Ht 66.0 in | Wt 217.6 lb

## 2022-04-20 DIAGNOSIS — I10 Essential (primary) hypertension: Secondary | ICD-10-CM

## 2022-04-20 DIAGNOSIS — Z23 Encounter for immunization: Secondary | ICD-10-CM

## 2022-04-20 DIAGNOSIS — M5432 Sciatica, left side: Secondary | ICD-10-CM

## 2022-04-20 DIAGNOSIS — L304 Erythema intertrigo: Secondary | ICD-10-CM | POA: Diagnosis not present

## 2022-04-20 DIAGNOSIS — J309 Allergic rhinitis, unspecified: Secondary | ICD-10-CM | POA: Diagnosis not present

## 2022-04-20 DIAGNOSIS — E559 Vitamin D deficiency, unspecified: Secondary | ICD-10-CM | POA: Diagnosis not present

## 2022-04-20 DIAGNOSIS — Z0001 Encounter for general adult medical examination with abnormal findings: Secondary | ICD-10-CM | POA: Diagnosis not present

## 2022-04-20 DIAGNOSIS — E782 Mixed hyperlipidemia: Secondary | ICD-10-CM

## 2022-04-20 DIAGNOSIS — E1169 Type 2 diabetes mellitus with other specified complication: Secondary | ICD-10-CM

## 2022-04-20 DIAGNOSIS — Z8639 Personal history of other endocrine, nutritional and metabolic disease: Secondary | ICD-10-CM

## 2022-04-20 MED ORDER — LEVOCETIRIZINE DIHYDROCHLORIDE 5 MG PO TABS: 5.0000 mg | ORAL_TABLET | Freq: Every evening | ORAL | 3 refills | Status: DC

## 2022-04-20 MED ORDER — KETOCONAZOLE 2 % EX CREA: 1.0000 | TOPICAL_CREAM | Freq: Every day | CUTANEOUS | 0 refills | Status: DC

## 2022-04-20 NOTE — Assessment & Plan Note (Addendum)
In groin and underneath abdominal folds Ketoconazole cream Advised to keep area clean and dry

## 2022-04-20 NOTE — Assessment & Plan Note (Signed)
On statin.

## 2022-04-20 NOTE — Patient Instructions (Addendum)
Please take Tylenol arthritis for back pain/sciatica.  Avoid heavy lifting and frequent bending.  Please apply Ketoconazole cream for rash.  Please perform scheduled voiding. Cut down caffeinated products to help with urinary incontinence. Can try Kegel exercises to help with incontinence.

## 2022-04-20 NOTE — Assessment & Plan Note (Signed)
Her left hip pain could be due to arthritis Radiating pain to the left leg likely due to sciatica Advised to avoid heavy lifting and frequent bending Tylenol as needed for pain Voltaren gel as needed for arthritic pain If persistent pain, will get imaging and/or orthopedic surgery referral

## 2022-04-20 NOTE — Assessment & Plan Note (Signed)
Lab Results  Component Value Date   HGBA1C 7.3 (A) 10/18/2021   Associated with HTN, HLD and obesity On Metformin 500 mg BID Advised to follow diabetic diet On statin and ACEi F/u CMP and lipid panel Diabetic eye exam: Advised to follow up with Ophthalmology for diabetic eye exam

## 2022-04-20 NOTE — Assessment & Plan Note (Signed)
BP Readings from Last 1 Encounters:  04/20/22 128/86   Well-controlled with enalapril Counseled for compliance with the medications Advised DASH diet and moderate exercise/walking, at least 150 mins/week

## 2022-04-20 NOTE — Progress Notes (Signed)
Established Patient Office Visit  Subjective:  Patient ID: [name], female    DOB: 1951/05/26  Age: 71 y.o. MRN: [id number]  CC:  Chief Complaint  Patient presents with   Annual Exam    Annual exam patient has numbness in left thumb that comes and goes and has skin breakage under stomach  her left hip all the way down to foot is in pain but that comes and goes also cant hold urinate like she used to sinus issues runny nose sneezing itching eyes     HPI [name] is a 71 y.o. female with past medical history of HTN, allergic sinusitis, type II DM, osteopenia, HLD and obesity who presents for annual physical.  She complains of numbness of the left hand near the thenar eminence area.  She denies any pain around the thumb.  Denies any recent injury.  Denies any swelling or redness in the area.  Her handgrip is intact.  She still complains of intermittent low back pain, radiating to left LE.  Denies any recent injury.  Her back pain is chronic, sharp and is intermittent.  She also reports having a bump on left buttock area, which was found to be lipoma and is less likely due to into her hip and leg pain.  Denies any numbness or tingling of the LE currently.  She reports redness and itching of skin underneath the abdominal folds.  She reports urinary urgency, but denies any urinary frequency, dysuria or hematuria.  She prefers to avoid medicine for urge incontinence currently.  She still complains of nasal congestion, sneezing and watery eyes.  She has history of allergic sinusitis and has tried OTC sinus medicine without much relief.  She was given Flonase in the past.    Past Medical History:  Diagnosis Date   Allergy    Anemia    Hyperlipidemia    Hypertension    Sleep apnea    Type 2 diabetes mellitus ([place])     Past Surgical History:  Procedure Laterality Date   ABDOMINAL HYSTERECTOMY     fibroids   COLONOSCOPY WITH PROPOFOL N/A 06/22/2021   Procedure:  COLONOSCOPY WITH PROPOFOL;  Surgeon: Eloise Harman, DO;  [facility];  Service: Endoscopy;  Laterality: N/A;  1:00 / ASA II   fatty tumor     removed   POLYPECTOMY  06/22/2021   Procedure: POLYPECTOMY;  Surgeon: Eloise Harman, DO;  [facility];  Service: Endoscopy;;    Family History  Problem Relation Age of Onset   Alzheimer's disease Mother    Cancer Mother        unknown   Alcohol abuse Father    COPD Father    Diabetes Sister    Hypertension Sister    Huntington's disease Daughter     Social History   Socioeconomic History   Marital status: Divorced    Spouse name: Not on file   Number of children: 2   Years of education: Not on file   Highest education level: 11th grade  Occupational History   Occupation: unemployed    Comment: caregiver  Tobacco Use   Smoking status: Former    Years: 2.00    Types: Cigarettes    Start date: 08/02/1971    Quit date: 08/01/1986    Years since quitting: 35.7   Smokeless tobacco: Never  Vaping Use   Vaping Use: Never used  Substance and Sexual Activity   Alcohol use: No  Drug use: No   Sexual activity: Never    Birth control/protection: Surgical  Other Topics Concern   Not on file  Social History Narrative   Lives with disabled mother with Alzheimer's   Daughter with Hungtington's passed away 18-Aug-2019.   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   Social Determinants of Health   Financial Resource Strain: Not on file  Food Insecurity: Not on file  Transportation Needs: Not on file  Physical Activity: Not on file  Stress: Not on file  Social Connections: Not on file  Intimate Partner Violence: Not on file    Outpatient Medications Prior to Visit  Medication Sig Dispense Refill   aspirin 81 MG tablet Take 81 mg by mouth daily.     chlorpheniramine (CHLOR-TRIMETON) 4 MG tablet Take 4 mg by mouth daily.     Cholecalciferol (VITAMIN D3) 50  MCG (2000 UT) TABS Take 2,000 Units by mouth daily.     enalapril (VASOTEC) 5 MG tablet Take 1 tablet (5 mg total) by mouth daily. 90 tablet 1   metFORMIN (GLUCOPHAGE) 500 MG tablet Take 1 tablet (500 mg total) by mouth 2 (two) times daily with a meal. 180 tablet 1   Multiple Vitamin (MULTIVITAMINS PO) Take 1 tablet by mouth daily.     Omega-3 Fatty Acids (FISH OIL) 1000 MG CAPS Take 2,000 mg by mouth 2 (two) times daily.     ONETOUCH ULTRA test strip USE 1 STRIP TO CHECK GLUCOSE ONCE DAILY     simvastatin (ZOCOR) 40 MG tablet TAKE 1 TABLET BY MOUTH ONCE DAILY IN THE EVENING AT 6PM 90 tablet 0   ketoconazole (NIZORAL) 2 % cream Apply 1 application topically daily. 15 g 0   fluticasone (FLONASE) 50 MCG/ACT nasal spray Place 2 sprays into both nostrils daily. (Patient not taking: Reported on 11/22/2021) 16 g 6   mupirocin nasal ointment (BACTROBAN) 2 % Place 1 application into the nose 2 (two) times daily. Use one-half of tube in each nostril twice daily for five (5) days. After application, press sides of nose together and gently massage. (Patient not taking: Reported on 11/22/2021) 10 g 0   No facility-administered medications prior to visit.    Allergies  Allergen Reactions   Sulfonamide Derivatives Other (See Comments)    Does not recall reaction     ROS Review of Systems  Constitutional:  Negative for chills and fever.  HENT:  Positive for congestion and rhinorrhea. Negative for sinus pressure, sinus pain and sore throat.   Eyes:  Positive for discharge. Negative for pain.  Respiratory:  Negative for cough and wheezing.   Cardiovascular:  Negative for chest pain and palpitations.  Gastrointestinal:  Negative for abdominal pain, diarrhea, nausea and vomiting.  Endocrine: Negative for polydipsia and polyuria.  Genitourinary:  Negative for dysuria and hematuria.  Musculoskeletal:  Negative for neck pain and neck stiffness.       Left hip and leg pain  Skin:  Positive for rash.        Mole over abdominal wall  Neurological:  Negative for dizziness and weakness.  Psychiatric/Behavioral:  Negative for agitation and behavioral problems.       Objective:    Physical Exam Vitals reviewed.  Constitutional:      General: She is not in acute distress.    Appearance: She is not diaphoretic.  HENT:     Head: Normocephalic and atraumatic.     Nose: Congestion present.  Mouth/Throat:     Mouth: Mucous membranes are moist.  Eyes:     General: No scleral icterus.    Extraocular Movements: Extraocular movements intact.  Cardiovascular:     Rate and Rhythm: Normal rate and regular rhythm.     Pulses: Normal pulses.     Heart sounds: Normal heart sounds. No murmur heard. Pulmonary:     Breath sounds: Normal breath sounds. No wheezing or rales.  Abdominal:     Palpations: Abdomen is soft.     Tenderness: There is no abdominal tenderness.  Musculoskeletal:     Cervical back: Neck supple. No tenderness.     Right lower leg: No edema.     Left lower leg: No edema.  Skin:    General: Skin is warm.     Findings: Rash (Erythematous rash underneath the abdominal folds) present.     Comments: Round mass noted over left buttock, about 2 cm in diameter Cyst like structure, about 0.5 cm in diameter in left side of perianal area 2 brownish moles with clear margin on left side of abdominal wall  Neurological:     General: No focal deficit present.     Mental Status: She is alert and oriented to person, place, and time.     Cranial Nerves: No cranial nerve deficit.     Sensory: No sensory deficit.     Motor: No weakness.  Psychiatric:        Mood and Affect: Mood normal.        Behavior: Behavior normal.     BP 128/86 (BP Location: Right Arm, Patient Position: Sitting, Cuff Size: Normal)   Pulse 82   Resp 18   Ht _0  (1.676 m)   Wt 217 lb 9.6 oz (98.7 kg)   SpO2 99%   BMI 35.12 kg/m  Wt Readings from Last 3 Encounters:  04/20/22 217 lb 9.6 oz (98.7 kg)  11/22/21  220 lb (99.8 kg)  10/19/21 223 lb (101.2 kg)    Lab Results  Component Value Date   TSH 1.850 06/10/2021   Lab Results  Component Value Date   WBC 5.0 10/14/2019   HGB 12.3 10/14/2019   HCT 38.5 10/14/2019   MCV 83.9 10/14/2019   PLT 306 10/14/2019   Lab Results  Component Value Date   NA 143 06/10/2021   K 4.8 06/10/2021   CO2 28 06/10/2021   GLUCOSE 123 (H) 06/10/2021   BUN 9 06/10/2021   CREATININE 0.82 06/10/2021   BILITOT <0.2 06/10/2021   ALKPHOS 64 06/10/2021   AST 19 06/10/2021   ALT 18 06/10/2021   PROT 7.4 06/10/2021   ALBUMIN 4.4 06/10/2021   CALCIUM 10.0 06/10/2021   ANIONGAP 7 01/16/2017   EGFR 77 06/10/2021   Lab Results  Component Value Date   CHOL 126 02/11/2021   Lab Results  Component Value Date   HDL 32 (L) 02/11/2021   Lab Results  Component Value Date   LDLCALC 70 02/11/2021   Lab Results  Component Value Date   TRIG 159 (H) 02/11/2021   Lab Results  Component Value Date   CHOLHDL 3.9 02/11/2021   Lab Results  Component Value Date   HGBA1C 7.3 (A) 10/18/2021      Assessment & Plan:   Problem List Items Addressed This Visit       Cardiovascular and Mediastinum   Essential hypertension, benign    BP Readings from Last 1 Encounters:  04/20/22 128/86  Well-controlled with enalapril  Counseled for compliance with the medications Advised DASH diet and moderate exercise/walking, at least 150 mins/week        Respiratory   Allergic sinusitis    Had started Flonase Added Xyzal Advised to use humidifier at nighttime.      Relevant Medications   levocetirizine (XYZAL) 5 MG tablet     Endocrine   Diabetes mellitus ([place])    Lab Results  Component Value Date   HGBA1C 7.3 (A) 10/18/2021  Associated with HTN, HLD and obesity On Metformin 500 mg BID Advised to follow diabetic diet On statin and ACEi F/u CMP and lipid panel Diabetic eye exam: Advised to follow up with Ophthalmology for diabetic eye exam      Relevant  Orders   Hemoglobin A1c   CMP14+EGFR   Urine Microalbumin w/creat. ratio     Nervous and Auditory   Sciatica of left side    Her left hip pain could be due to arthritis Radiating pain to the left leg likely due to sciatica Advised to avoid heavy lifting and frequent bending Tylenol as needed for pain Voltaren gel as needed for arthritic pain If persistent pain, will get imaging and/or orthopedic surgery referral        Musculoskeletal and Integument   Intertrigo    In groin and underneath abdominal folds Ketoconazole cream Advised to keep area clean and dry      Relevant Medications   ketoconazole (NIZORAL) 2 % cream     Other   Hyperlipidemia    On statin      Relevant Orders   Lipid panel   History of hypothyroidism   Relevant Orders   TSH   Encounter for general adult medical examination with abnormal findings - Primary    Physical exam as documented. Counseling done  re healthy lifestyle involving commitment to 150 minutes exercise per week, heart healthy diet, and attaining healthy weight.The importance of adequate sleep also discussed. Changes in health habits are decided on by the patient with goals and time frames  set for achieving them. Immunization and cancer screening needs are specifically addressed at this visit.      Relevant Orders   CMP14+EGFR   CBC with Differential/Platelet   Other Visit Diagnoses     Need for immunization against influenza       Relevant Orders   Flu Vaccine QUAD High Dose(Fluad) (Completed)   Vitamin D deficiency       Relevant Orders   VITAMIN D 25 Hydroxy (Vit-D Deficiency, Fractures)       Meds ordered this encounter  Medications   levocetirizine (XYZAL) 5 MG tablet    Sig: Take 1 tablet (5 mg total) by mouth every evening.    Dispense:  30 tablet    Refill:  3   ketoconazole (NIZORAL) 2 % cream    Sig: Apply 1 Application topically daily.    Dispense:  15 g    Refill:  0    Follow-up: Return in about 4  months (around 08/20/2022) for DM and HTN.    Lindell Spar, MD

## 2022-04-20 NOTE — Assessment & Plan Note (Signed)

## 2022-04-20 NOTE — Assessment & Plan Note (Signed)
Had started Flonase Added Xyzal Advised to use humidifier at nighttime.

## 2022-04-21 ENCOUNTER — Telehealth: Payer: Self-pay

## 2022-04-21 LAB — LIPID PANEL
Chol/HDL Ratio: 4.7 ratio — ABNORMAL HIGH (ref 0.0–4.4)
Cholesterol, Total: 160 mg/dL (ref 100–199)
HDL: 34 mg/dL — ABNORMAL LOW (ref >39)
LDL Chol Calc (NIH): 103 mg/dL — ABNORMAL HIGH (ref 0–99)
Triglycerides: 127 mg/dL (ref 0–149)
VLDL Cholesterol Cal: 23 mg/dL (ref 5–40)

## 2022-04-21 LAB — CBC WITH DIFFERENTIAL/PLATELET
Basophils Absolute: 0.1 10*3/uL (ref 0.0–0.2)
Basos: 1 %
EOS (ABSOLUTE): 0.2 10*3/uL (ref 0.0–0.4)
Eos: 5 %
Hematocrit: 39.7 % (ref 34.0–46.6)
Hemoglobin: 12.8 g/dL (ref 11.1–15.9)
Immature Grans (Abs): 0 10*3/uL (ref 0.0–0.1)
Immature Granulocytes: 0 %
Lymphocytes Absolute: 2.2 10*3/uL (ref 0.7–3.1)
Lymphs: 42 %
MCH: 27.1 pg (ref 26.6–33.0)
MCHC: 32.2 g/dL (ref 31.5–35.7)
MCV: 84 fL (ref 79–97)
Monocytes Absolute: 0.5 10*3/uL (ref 0.1–0.9)
Monocytes: 9 %
Neutrophils Absolute: 2.3 10*3/uL (ref 1.4–7.0)
Neutrophils: 43 %
Platelets: 331 10*3/uL (ref 150–450)
RBC: 4.72 x10E6/uL (ref 3.77–5.28)
RDW: 12.5 % (ref 11.7–15.4)
WBC: 5.2 10*3/uL (ref 3.4–10.8)

## 2022-04-21 LAB — CMP14+EGFR
ALT: 18 IU/L (ref 0–32)
AST: 17 IU/L (ref 0–40)
Albumin/Globulin Ratio: 1.4 (ref 1.2–2.2)
Albumin: 4.3 g/dL (ref 3.9–4.9)
Alkaline Phosphatase: 61 IU/L (ref 44–121)
BUN/Creatinine Ratio: 9 — ABNORMAL LOW (ref 12–28)
BUN: 8 mg/dL (ref 8–27)
Bilirubin Total: 0.3 mg/dL (ref 0.0–1.2)
CO2: 26 mmol/L (ref 20–29)
Calcium: 10 mg/dL (ref 8.7–10.3)
Chloride: 104 mmol/L (ref 96–106)
Creatinine, Ser: 0.88 mg/dL (ref 0.57–1.00)
Globulin, Total: 3 g/dL (ref 1.5–4.5)
Glucose: 114 mg/dL — ABNORMAL HIGH (ref 70–99)
Potassium: 5.2 mmol/L (ref 3.5–5.2)
Sodium: 144 mmol/L (ref 134–144)
Total Protein: 7.3 g/dL (ref 6.0–8.5)
eGFR: 71 mL/min/{1.73_m2} (ref >59)

## 2022-04-21 LAB — TSH: TSH: 1.38 u[IU]/mL (ref 0.450–4.500)

## 2022-04-21 LAB — VITAMIN D 25 HYDROXY (VIT D DEFICIENCY, FRACTURES): Vit D, 25-Hydroxy: 41 ng/mL (ref 30.0–100.0)

## 2022-04-21 LAB — HEMOGLOBIN A1C
Est. average glucose Bld gHb Est-mCnc: 160 mg/dL
Hgb A1c MFr Bld: 7.2 % — ABNORMAL HIGH (ref 4.8–5.6)

## 2022-04-21 NOTE — Telephone Encounter (Signed)
Patient returning lab result call

## 2022-04-21 NOTE — Telephone Encounter (Signed)
Spoke with patient.

## 2022-04-22 LAB — MICROALBUMIN / CREATININE URINE RATIO
Creatinine, Urine: 180.1 mg/dL
Microalb/Creat Ratio: 36 mg/g creat — ABNORMAL HIGH (ref 0–29)
Microalbumin, Urine: 64.4 ug/mL

## 2022-06-08 ENCOUNTER — Other Ambulatory Visit: Payer: Self-pay | Admitting: Internal Medicine

## 2022-06-08 DIAGNOSIS — E119 Type 2 diabetes mellitus without complications: Secondary | ICD-10-CM

## 2022-06-17 ENCOUNTER — Other Ambulatory Visit (INDEPENDENT_AMBULATORY_CARE_PROVIDER_SITE_OTHER): Payer: Self-pay | Admitting: Nurse Practitioner

## 2022-06-17 DIAGNOSIS — E119 Type 2 diabetes mellitus without complications: Secondary | ICD-10-CM

## 2022-06-20 ENCOUNTER — Other Ambulatory Visit: Payer: Self-pay

## 2022-06-20 DIAGNOSIS — E119 Type 2 diabetes mellitus without complications: Secondary | ICD-10-CM

## 2022-06-20 MED ORDER — LANCETS MISC: 6 refills | Status: DC

## 2022-06-22 ENCOUNTER — Other Ambulatory Visit: Payer: Self-pay | Admitting: Internal Medicine

## 2022-06-22 DIAGNOSIS — I10 Essential (primary) hypertension: Secondary | ICD-10-CM

## 2022-06-27 ENCOUNTER — Other Ambulatory Visit: Payer: Self-pay

## 2022-06-27 MED ORDER — ONETOUCH ULTRA VI STRP: ORAL_STRIP | 3 refills | Status: DC

## 2022-07-29 ENCOUNTER — Other Ambulatory Visit: Payer: Self-pay | Admitting: Internal Medicine

## 2022-07-29 DIAGNOSIS — E782 Mixed hyperlipidemia: Secondary | ICD-10-CM

## 2022-08-19 ENCOUNTER — Telehealth: Payer: Self-pay | Admitting: Internal Medicine

## 2022-08-19 ENCOUNTER — Other Ambulatory Visit: Payer: Self-pay

## 2022-08-19 DIAGNOSIS — E119 Type 2 diabetes mellitus without complications: Secondary | ICD-10-CM

## 2022-08-19 MED ORDER — LANCET DEVICE MISC: 1.0000 | 0 refills | Status: DC

## 2022-08-19 NOTE — Telephone Encounter (Signed)
Returned patient call and placed order

## 2022-08-19 NOTE — Telephone Encounter (Signed)
Pt called back stating that it is not the meter that it broke its the thing the lancets go into to stick your finger

## 2022-08-19 NOTE — Telephone Encounter (Signed)
Patient needs refill on Lancets MISC   Also needs new glucose meter current one is broken

## 2022-08-22 ENCOUNTER — Encounter: Payer: Self-pay | Admitting: Internal Medicine

## 2022-08-22 ENCOUNTER — Ambulatory Visit (INDEPENDENT_AMBULATORY_CARE_PROVIDER_SITE_OTHER): Payer: 59 | Admitting: Internal Medicine

## 2022-08-22 VITALS — BP 140/72 | HR 79 | Ht 66.0 in | Wt 222.6 lb

## 2022-08-22 DIAGNOSIS — I10 Essential (primary) hypertension: Secondary | ICD-10-CM | POA: Diagnosis not present

## 2022-08-22 DIAGNOSIS — E1169 Type 2 diabetes mellitus with other specified complication: Secondary | ICD-10-CM | POA: Diagnosis not present

## 2022-08-22 DIAGNOSIS — E782 Mixed hyperlipidemia: Secondary | ICD-10-CM | POA: Diagnosis not present

## 2022-08-22 DIAGNOSIS — J0110 Acute frontal sinusitis, unspecified: Secondary | ICD-10-CM | POA: Diagnosis not present

## 2022-08-22 LAB — POCT GLYCOSYLATED HEMOGLOBIN (HGB A1C): HbA1c, POC (controlled diabetic range): 7.8 % — AB (ref 0.0–7.0)

## 2022-08-22 MED ORDER — AZITHROMYCIN 250 MG PO TABS: ORAL_TABLET | ORAL | 0 refills | Status: AC

## 2022-08-22 MED ORDER — ONETOUCH ULTRA 2 W/DEVICE KIT: PACK | 0 refills | Status: DC

## 2022-08-22 MED ORDER — ENALAPRIL MALEATE 10 MG PO TABS: 10.0000 mg | ORAL_TABLET | Freq: Every day | ORAL | 5 refills | Status: DC

## 2022-08-22 NOTE — Assessment & Plan Note (Addendum)
BP Readings from Last 1 Encounters:  08/22/22 (!) 140/72   Uncontrolled with enalapril 5 mg QD Increased dose of enalapril to 10 mg QD Counseled for compliance with the medications Advised DASH diet and moderate exercise/walking, at least 150 mins/week

## 2022-08-22 NOTE — Progress Notes (Signed)
Established Patient Office Visit  Subjective:  Patient ID: [name], female    DOB: 21-Sep-1950  Age: 72 y.o. MRN: [id number]  CC:  Chief Complaint  Patient presents with   Hypertension    Patient is having four month follow up on her hypertension and diabetes. She states for a week and a half she has had a head cold, stuffiness, snotty nose, has been taking tylenol cold medicine. Her left side is really sore    HPI [name] is a 72 y.o. female with past medical history of HTN, allergic sinusitis, type II DM, osteopenia, HLD and obesity who presents for f/u of her chronic medical conditions.  She complains of nasal congestion, sinus pressure related left-sided frontal headache and postnasal drip for the last 1 week.  She denies any fever or chills.  Denies any dyspnea or wheezing.  She has tried Tylenol Cold and sinus medicine without much relief.  HTN: BP is elevated today. Takes medications regularly.  she has noticed high BP at home as well.  Patient denies dizziness, chest pain or palpitations.  Type II DM with HLD: She takes metformin for it, but only QD instead of BID.  Her HbA1c was 7.8 today. She denies any polyuria or polydipsia.  She takes Zocor for HLD.    Past Medical History:  Diagnosis Date   Allergy    Anemia    Hyperlipidemia    Hypertension    Sleep apnea    Type 2 diabetes mellitus ([place])     Past Surgical History:  Procedure Laterality Date   ABDOMINAL HYSTERECTOMY     fibroids   COLONOSCOPY WITH PROPOFOL N/A 06/22/2021   Procedure: COLONOSCOPY WITH PROPOFOL;  Surgeon: Eloise Harman, DO;  [facility];  Service: Endoscopy;  Laterality: N/A;  1:00 / ASA II   fatty tumor     removed   POLYPECTOMY  06/22/2021   Procedure: POLYPECTOMY;  Surgeon: Eloise Harman, DO;  [facility];  Service: Endoscopy;;    Family History  Problem Relation Age of Onset   Alzheimer's disease Mother    Cancer Mother         unknown   Alcohol abuse Father    COPD Father    Diabetes Sister    Hypertension Sister    Huntington's disease Daughter     Social History   Socioeconomic History   Marital status: Divorced    Spouse name: Not on file   Number of children: 2   Years of education: Not on file   Highest education level: 11th grade  Occupational History   Occupation: unemployed    Comment: caregiver  Tobacco Use   Smoking status: Former    Years: 2.00    Types: Cigarettes    Start date: 08/02/1971    Quit date: 08/01/1986    Years since quitting: 36.0   Smokeless tobacco: Never  Vaping Use   Vaping Use: Never used  Substance and Sexual Activity   Alcohol use: No   Drug use: No   Sexual activity: Never    Birth control/protection: Surgical  Other Topics Concern   Not on file  Social History Narrative   Lives with disabled mother with Alzheimer's   Daughter with Hungtington's passed away Sep 03, 2019.   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   Social Determinants of Health   Financial Resource Strain: Not on file  Food Insecurity: Not on file  Transportation Needs: Not on file  Physical Activity: Not on file  Stress: Not on file  Social Connections: Not on file  Intimate Partner Violence: Not on file    Outpatient Medications Prior to Visit  Medication Sig Dispense Refill   aspirin 81 MG tablet Take 81 mg by mouth daily.     chlorpheniramine (CHLOR-TRIMETON) 4 MG tablet Take 4 mg by mouth daily.     Cholecalciferol (VITAMIN D3) 50 MCG (2000 UT) TABS Take 2,000 Units by mouth daily.     fluticasone (FLONASE) 50 MCG/ACT nasal spray Place 2 sprays into both nostrils daily. (Patient not taking: Reported on 11/22/2021) 16 g 6   ketoconazole (NIZORAL) 2 % cream Apply 1 Application topically daily. 15 g 0   Lancet Device MISC 1 each by Does not apply route as directed. One touch Delica 1 each 0   Lancets MISC Use to test blood sugar daily  100 each 6   levocetirizine (XYZAL) 5 MG tablet Take 1 tablet (5 mg total) by mouth every evening. 30 tablet 3   metFORMIN (GLUCOPHAGE) 500 MG tablet TAKE 1 TABLET BY MOUTH TWICE DAILY WITH A MEAL 180 tablet 0   Multiple Vitamin (MULTIVITAMINS PO) Take 1 tablet by mouth daily.     Omega-3 Fatty Acids (FISH OIL) 1000 MG CAPS Take 2,000 mg by mouth 2 (two) times daily.     ONETOUCH ULTRA test strip USE 1 STRIP TO CHECK GLUCOSE ONCE DAILY 100 each 3   simvastatin (ZOCOR) 40 MG tablet TAKE 1 TABLET BY MOUTH ONCE DAILY IN THE EVENING AT 6 PM 90 tablet 0   enalapril (VASOTEC) 5 MG tablet Take 1 tablet by mouth once daily 90 tablet 0   No facility-administered medications prior to visit.    Allergies  Allergen Reactions   Sulfonamide Derivatives Other (See Comments)    Does not recall reaction     ROS Review of Systems  Constitutional:  Negative for chills and fever.  HENT:  Positive for congestion, postnasal drip, sinus pressure and sinus pain.   Eyes:  Negative for pain and discharge.  Respiratory:  Negative for cough and wheezing.   Cardiovascular:  Negative for chest pain and palpitations.  Gastrointestinal:  Negative for abdominal pain, diarrhea, nausea and vomiting.  Endocrine: Negative for polydipsia and polyuria.  Genitourinary:  Negative for dysuria and hematuria.  Musculoskeletal:  Negative for neck pain and neck stiffness.       Left hip and leg pain  Skin:        Mole over abdominal wall  Neurological:  Positive for headaches. Negative for dizziness and weakness.  Psychiatric/Behavioral:  Negative for agitation and behavioral problems.       Objective:    Physical Exam Vitals reviewed.  Constitutional:      General: She is not in acute distress.    Appearance: She is not diaphoretic.  HENT:     Head: Normocephalic and atraumatic.     Nose: Congestion present.     Left Sinus: Frontal sinus tenderness present.     Mouth/Throat:     Mouth: Mucous membranes are  moist.  Eyes:     General: No scleral icterus.    Extraocular Movements: Extraocular movements intact.  Cardiovascular:     Rate and Rhythm: Normal rate and regular rhythm.     Pulses: Normal pulses.     Heart sounds: Normal heart sounds. No murmur heard. Pulmonary:     Breath sounds: Normal breath sounds. No wheezing  or rales.  Abdominal:     Palpations: Abdomen is soft.     Tenderness: There is no abdominal tenderness.  Musculoskeletal:     Cervical back: Neck supple. No tenderness.     Right lower leg: No edema.     Left lower leg: No edema.  Skin:    General: Skin is warm.     Comments: 2 brownish moles with clear margin on left side of abdominal wall  Neurological:     General: No focal deficit present.     Mental Status: She is alert and oriented to person, place, and time.  Psychiatric:        Mood and Affect: Mood normal.        Behavior: Behavior normal.     BP (!) 140/72 (BP Location: Right Arm, Patient Position: Sitting, Cuff Size: Normal)   Pulse 79   Ht '5\' 6"'$  (1.676 m)   Wt 222 lb 9.6 oz (101 kg)   SpO2 97%   BMI 35.93 kg/m  Wt Readings from Last 3 Encounters:  08/22/22 222 lb 9.6 oz (101 kg)  04/20/22 217 lb 9.6 oz (98.7 kg)  11/22/21 220 lb (99.8 kg)    Lab Results  Component Value Date   TSH 1.380 04/20/2022   Lab Results  Component Value Date   WBC 5.2 04/20/2022   HGB 12.8 04/20/2022   HCT 39.7 04/20/2022   MCV 84 04/20/2022   PLT 331 04/20/2022   Lab Results  Component Value Date   NA 144 04/20/2022   K 5.2 04/20/2022   CO2 26 04/20/2022   GLUCOSE 114 (H) 04/20/2022   BUN 8 04/20/2022   CREATININE 0.88 04/20/2022   BILITOT 0.3 04/20/2022   ALKPHOS 61 04/20/2022   AST 17 04/20/2022   ALT 18 04/20/2022   PROT 7.3 04/20/2022   ALBUMIN 4.3 04/20/2022   CALCIUM 10.0 04/20/2022   ANIONGAP 7 01/16/2017   EGFR 71 04/20/2022   Lab Results  Component Value Date   CHOL 160 04/20/2022   Lab Results  Component Value Date   HDL 34 (L)  04/20/2022   Lab Results  Component Value Date   LDLCALC 103 (H) 04/20/2022   Lab Results  Component Value Date   TRIG 127 04/20/2022   Lab Results  Component Value Date   CHOLHDL 4.7 (H) 04/20/2022   Lab Results  Component Value Date   HGBA1C 7.8 (A) 08/22/2022      Assessment & Plan:   Problem List Items Addressed This Visit    Diabetes mellitus ([place]) Lab Results  Component Value Date   HGBA1C 7.8 (A) 08/22/2022   Associated with HTN, HLD and obesity Uncontrolled On Metformin 500 mg BID  - needs to take it BID, she agrees Advised to follow diabetic diet On statin and ACEi F/u CMP and lipid panel Diabetic eye exam: Advised to follow up with Ophthalmology for diabetic eye exam  Hyperlipidemia On statin  Essential hypertension, benign BP Readings from Last 1 Encounters:  08/22/22 (!) 140/72   Uncontrolled with enalapril 5 mg QD Increased dose of enalapril to 10 mg QD Counseled for compliance with the medications Advised DASH diet and moderate exercise/walking, at least 150 mins/week  Acute non-recurrent frontal sinusitis Started empiric azithromycin as she has persistent symptoms despite symptomatic treatment Nasal saline spray as needed for nasal congestion Advised to use vaporizer for nasal congestion    Meds ordered this encounter  Medications   enalapril (VASOTEC) 10 MG tablet  Sig: Take 1 tablet (10 mg total) by mouth daily.    Dispense:  30 tablet    Refill:  5    Dose change   azithromycin (ZITHROMAX) 250 MG tablet    Sig: Take 2 tablets on day 1, then 1 tablet daily on days 2 through 5    Dispense:  6 tablet    Refill:  0   Blood Glucose Monitoring Suppl (ONE TOUCH ULTRA 2) w/Device KIT    Sig: Use it to check blood glucose as instructed.    Dispense:  1 kit    Refill:  0     Follow-up: Return in about 6 weeks (around 10/03/2022) for HTN.    Lindell Spar, MD

## 2022-08-22 NOTE — Patient Instructions (Addendum)
Please start taking Enalapril 10 mg instead of 5 mg.  Please start taking Metformin 500 mg twice daily instead of once daily.  Please start taking Azithromycin as prescribed for sinusitis. Please use nasal saline spray and vaporizer for nasal congestion.

## 2022-08-22 NOTE — Assessment & Plan Note (Addendum)
Lab Results  Component Value Date   HGBA1C 7.8 (A) 08/22/2022   Associated with HTN, HLD and obesity Uncontrolled On Metformin 500 mg BID  - needs to take it BID, she agrees Advised to follow diabetic diet On statin and ACEi F/u CMP and lipid panel Diabetic eye exam: Advised to follow up with Ophthalmology for diabetic eye exam

## 2022-08-22 NOTE — Assessment & Plan Note (Signed)
Started empiric azithromycin as she has persistent symptoms despite symptomatic treatment Nasal saline spray as needed for nasal congestion Advised to use vaporizer for nasal congestion

## 2022-08-22 NOTE — Assessment & Plan Note (Signed)
On statin.

## 2022-09-21 ENCOUNTER — Other Ambulatory Visit: Payer: Self-pay | Admitting: Internal Medicine

## 2022-09-21 DIAGNOSIS — E782 Mixed hyperlipidemia: Secondary | ICD-10-CM

## 2022-09-26 ENCOUNTER — Telehealth: Payer: Self-pay | Admitting: Internal Medicine

## 2022-09-26 NOTE — Telephone Encounter (Signed)
Spoke to patient

## 2022-09-26 NOTE — Telephone Encounter (Signed)
Pt called stating she has been trying to get a refill & phar is stating she is needing a new rx?   Prescription Request  09/26/2022  Is this a "Controlled Substance" medicine? No  LOV: 08/22/2022  What is the name of the medication or equipment? simvastatin (ZOCOR) 40 MG tablet    SUPPOSED TO BE 2 TIMES DAILY & NEEDS THE NEW RX WRITTEN FOR THIS  enalapril (VASOTEC) 10 MG tablet   Have you contacted your pharmacy to request a refill? Yes   Which pharmacy would you like this sent to?  [place], [place] - [id number] [place] [address] Phone: [phone] Fax: [phone]  [place], [place] - [address] [place] [address] Phone: [phone] Fax: [phone]    Patient notified that their request is being sent to the clinical staff for review and that they should receive a response within 2 business days.   Please advise at Mobile [phone] (mobile)

## 2022-09-29 ENCOUNTER — Telehealth: Payer: Self-pay | Admitting: Internal Medicine

## 2022-09-29 NOTE — Telephone Encounter (Signed)
Spoke to patient

## 2022-09-29 NOTE — Telephone Encounter (Signed)
Patient needs refill on simvastatin (ZOCOR) 40 MG tablet   Patient states that pharm is having an issue refilling. States that needs new prescription sent in. Patient is not 100% sure of what pharm has said.  Patient is out of med and pharm will not refill.    [facility] [place] [id number] [place], [place] [address] Phone: [phone]  Fax: [phone]

## 2022-10-04 ENCOUNTER — Encounter: Payer: Self-pay | Admitting: Internal Medicine

## 2022-10-04 ENCOUNTER — Ambulatory Visit (INDEPENDENT_AMBULATORY_CARE_PROVIDER_SITE_OTHER): Payer: 59 | Admitting: Internal Medicine

## 2022-10-04 VITALS — BP 123/70 | HR 74 | Ht 66.0 in | Wt 219.2 lb

## 2022-10-04 DIAGNOSIS — E1169 Type 2 diabetes mellitus with other specified complication: Secondary | ICD-10-CM | POA: Diagnosis not present

## 2022-10-04 DIAGNOSIS — E782 Mixed hyperlipidemia: Secondary | ICD-10-CM

## 2022-10-04 DIAGNOSIS — Z23 Encounter for immunization: Secondary | ICD-10-CM | POA: Diagnosis not present

## 2022-10-04 DIAGNOSIS — I10 Essential (primary) hypertension: Secondary | ICD-10-CM | POA: Diagnosis not present

## 2022-10-04 MED ORDER — METFORMIN HCL 500 MG PO TABS: 500.0000 mg | ORAL_TABLET | Freq: Two times a day (BID) | ORAL | 1 refills | Status: DC

## 2022-10-04 MED ORDER — SIMVASTATIN 40 MG PO TABS: 40.0000 mg | ORAL_TABLET | Freq: Every day | ORAL | 3 refills | Status: DC

## 2022-10-04 NOTE — Patient Instructions (Signed)
Please continue to take medications as prescribed. ? ?Please continue to follow low carb diet and perform moderate exercise/walking at least 150 mins/week. ?

## 2022-10-04 NOTE — Progress Notes (Signed)
Established Patient Office Visit  Subjective:  Patient ID: [name], female    DOB: [birth year]  Age: 72 y.o. MRN: [id number]  CC:  Chief Complaint  Patient presents with   Hypertension    Six week follow up    HPI [name] is a 72 y.o. female with past medical history of HTN, allergic sinusitis, type II DM, osteopenia, HLD and obesity who presents for f/u of her chronic medical conditions.  HTN: BP is wnl today. Takes medications regularly.  Patient denies dizziness, chest pain or palpitations.  Type II DM with HLD: She takes metformin for it, has started taking it BID now.  Her HbA1c was 7.8 in 01/24. She denies any polyuria or polydipsia.  She takes Zocor for HLD.  She received PCV20 vaccine today.  Past Medical History:  Diagnosis Date   Allergy    Anemia    Hyperlipidemia    Hypertension    Sleep apnea    Type 2 diabetes mellitus ([place])     Past Surgical History:  Procedure Laterality Date   ABDOMINAL HYSTERECTOMY     fibroids   COLONOSCOPY WITH PROPOFOL N/A 06/22/2021   Procedure: COLONOSCOPY WITH PROPOFOL;  Surgeon: Eloise Harman, DO;  [facility];  Service: Endoscopy;  Laterality: N/A;  1:00 / ASA II   fatty tumor     removed   POLYPECTOMY  06/22/2021   Procedure: POLYPECTOMY;  Surgeon: Eloise Harman, DO;  Location: [facility];  Service: Endoscopy;;    Family History  Problem Relation Age of Onset   Alzheimer's disease Mother    Cancer Mother        unknown   Alcohol abuse Father    COPD Father    Diabetes Sister    Hypertension Sister    Huntington's disease Daughter     Social History   Socioeconomic History   Marital status: Divorced    Spouse name: Not on file   Number of children: 2   Years of education: Not on file   Highest education level: 11th grade  Occupational History   Occupation: unemployed    Comment: caregiver  Tobacco Use   Smoking status: Former    Years: 2.00    Types:  Cigarettes    Start date: 08/02/1971    Quit date: 08/01/1986    Years since quitting: 36.2   Smokeless tobacco: Never  Vaping Use   Vaping Use: Never used  Substance and Sexual Activity   Alcohol use: No   Drug use: No   Sexual activity: Never    Birth control/protection: Surgical  Other Topics Concern   Not on file  Social History Narrative   Lives with disabled mother with Alzheimer's   Daughter with Hungtington's passed away 2019-08-15.   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   Social Determinants of Health   Financial Resource Strain: Not on file  Food Insecurity: Not on file  Transportation Needs: Not on file  Physical Activity: Not on file  Stress: Not on file  Social Connections: Not on file  Intimate Partner Violence: Not on file    Outpatient Medications Prior to Visit  Medication Sig Dispense Refill   aspirin 81 MG tablet Take 81 mg by mouth daily.     Blood Glucose Monitoring Suppl (ONE TOUCH ULTRA 2) w/Device KIT Use it to check blood glucose as instructed. 1 kit 0   chlorpheniramine (CHLOR-TRIMETON) 4 MG tablet  Take 4 mg by mouth daily.     Cholecalciferol (VITAMIN D3) 50 MCG (2000 UT) TABS Take 2,000 Units by mouth daily.     enalapril (VASOTEC) 10 MG tablet Take 1 tablet (10 mg total) by mouth daily. 30 tablet 5   fluticasone (FLONASE) 50 MCG/ACT nasal spray Place 2 sprays into both nostrils daily. (Patient not taking: Reported on 11/22/2021) 16 g 6   ketoconazole (NIZORAL) 2 % cream Apply 1 Application topically daily. 15 g 0   Lancet Device MISC 1 each by Does not apply route as directed. One touch Delica 1 each 0   Lancets MISC Use to test blood sugar daily 100 each 6   levocetirizine (XYZAL) 5 MG tablet Take 1 tablet (5 mg total) by mouth every evening. 30 tablet 3   Multiple Vitamin (MULTIVITAMINS PO) Take 1 tablet by mouth daily.     Omega-3 Fatty Acids (FISH OIL) 1000 MG CAPS Take 2,000 mg by mouth 2  (two) times daily.     ONETOUCH ULTRA test strip USE 1 STRIP TO CHECK GLUCOSE ONCE DAILY 100 each 3   metFORMIN (GLUCOPHAGE) 500 MG tablet TAKE 1 TABLET BY MOUTH TWICE DAILY WITH A MEAL 180 tablet 0   simvastatin (ZOCOR) 40 MG tablet TAKE 1 TABLET BY MOUTH ONCE DAILY IN THE EVENING AT 6 PM 90 tablet 0   No facility-administered medications prior to visit.    Allergies  Allergen Reactions   Sulfonamide Derivatives Other (See Comments)    Does not recall reaction     ROS Review of Systems  Constitutional:  Negative for chills and fever.  HENT:  Negative for congestion, sinus pressure and sinus pain.   Eyes:  Negative for pain and discharge.  Respiratory:  Negative for cough and wheezing.   Cardiovascular:  Negative for chest pain and palpitations.  Gastrointestinal:  Negative for abdominal pain, diarrhea, nausea and vomiting.  Endocrine: Negative for polydipsia and polyuria.  Genitourinary:  Negative for dysuria and hematuria.  Musculoskeletal:  Negative for neck pain and neck stiffness.       Left hip and leg pain  Skin:        Mole over abdominal wall  Neurological:  Negative for dizziness and weakness.  Psychiatric/Behavioral:  Negative for agitation and behavioral problems.       Objective:    Physical Exam Vitals reviewed.  Constitutional:      General: She is not in acute distress.    Appearance: She is not diaphoretic.  HENT:     Head: Normocephalic and atraumatic.     Nose: No congestion.     Mouth/Throat:     Mouth: Mucous membranes are moist.  Eyes:     General: No scleral icterus.    Extraocular Movements: Extraocular movements intact.  Cardiovascular:     Rate and Rhythm: Normal rate and regular rhythm.     Pulses: Normal pulses.     Heart sounds: Normal heart sounds. No murmur heard. Pulmonary:     Breath sounds: Normal breath sounds. No wheezing or rales.  Musculoskeletal:     Cervical back: Neck supple. No tenderness.     Right lower leg: No edema.      Left lower leg: No edema.  Skin:    General: Skin is warm.     Comments: 2 brownish moles with clear margin on left side of abdominal wall  Neurological:     General: No focal deficit present.     Mental Status: She  is alert and oriented to person, place, and time.  Psychiatric:        Mood and Affect: Mood normal.        Behavior: Behavior normal.     BP 123/70 (BP Location: Right Arm, Patient Position: Sitting, Cuff Size: Large)   Pulse 74   Ht '5\' 6"'$  (1.676 m)   Wt 219 lb 3.2 oz (99.4 kg)   SpO2 96%   BMI 35.38 kg/m  Wt Readings from Last 3 Encounters:  10/04/22 219 lb 3.2 oz (99.4 kg)  08/22/22 222 lb 9.6 oz (101 kg)  04/20/22 217 lb 9.6 oz (98.7 kg)    Lab Results  Component Value Date   TSH 1.380 04/20/2022   Lab Results  Component Value Date   WBC 5.2 04/20/2022   HGB 12.8 04/20/2022   HCT 39.7 04/20/2022   MCV 84 04/20/2022   PLT 331 04/20/2022   Lab Results  Component Value Date   NA 144 04/20/2022   K 5.2 04/20/2022   CO2 26 04/20/2022   GLUCOSE 114 (H) 04/20/2022   BUN 8 04/20/2022   CREATININE 0.88 04/20/2022   BILITOT 0.3 04/20/2022   ALKPHOS 61 04/20/2022   AST 17 04/20/2022   ALT 18 04/20/2022   PROT 7.3 04/20/2022   ALBUMIN 4.3 04/20/2022   CALCIUM 10.0 04/20/2022   ANIONGAP 7 01/16/2017   EGFR 71 04/20/2022   Lab Results  Component Value Date   CHOL 160 04/20/2022   Lab Results  Component Value Date   HDL 34 (L) 04/20/2022   Lab Results  Component Value Date   LDLCALC 103 (H) 04/20/2022   Lab Results  Component Value Date   TRIG 127 04/20/2022   Lab Results  Component Value Date   CHOLHDL 4.7 (H) 04/20/2022   Lab Results  Component Value Date   HGBA1C 7.8 (A) 08/22/2022      Assessment & Plan:   Problem List Items Addressed This Visit    Diabetes mellitus ([place]) Lab Results  Component Value Date   HGBA1C 7.8 (A) 08/22/2022   Associated with HTN, HLD and obesity Uncontrolled, but improving - home blood  glucose has been 110-140 mostly On Metformin 500 mg BID Advised to follow diabetic diet On statin and ACEi F/u CMP and lipid panel Diabetic eye exam: Advised to follow up with Ophthalmology for diabetic eye exam  Essential hypertension, benign BP Readings from Last 1 Encounters:  10/04/22 123/70   Well-controlled with enalapril 10 mg QD Counseled for compliance with the medications Advised DASH diet and moderate exercise/walking, at least 150 mins/week  Hyperlipidemia On statin    Meds ordered this encounter  Medications   simvastatin (ZOCOR) 40 MG tablet    Sig: Take 1 tablet (40 mg total) by mouth daily at 6 PM.    Dispense:  90 tablet    Refill:  3   metFORMIN (GLUCOPHAGE) 500 MG tablet    Sig: Take 1 tablet (500 mg total) by mouth 2 (two) times daily with a meal.    Dispense:  180 tablet    Refill:  1     Follow-up: Return in about 4 months (around 02/03/2023) for DM and HTN.    Lindell Spar, MD

## 2022-10-04 NOTE — Assessment & Plan Note (Addendum)
Lab Results  Component Value Date   HGBA1C 7.8 (A) 08/22/2022   Associated with HTN, HLD and obesity Uncontrolled, but improving - home blood glucose has been 110-140 mostly On Metformin 500 mg BID Advised to follow diabetic diet On statin and ACEi F/u CMP and lipid panel Diabetic eye exam: Advised to follow up with Ophthalmology for diabetic eye exam

## 2022-10-04 NOTE — Assessment & Plan Note (Signed)
BP Readings from Last 1 Encounters:  10/04/22 123/70   Well-controlled with enalapril 10 mg QD Counseled for compliance with the medications Advised DASH diet and moderate exercise/walking, at least 150 mins/week

## 2022-10-04 NOTE — Assessment & Plan Note (Signed)
On statin.

## 2022-10-05 LAB — CMP14+EGFR
ALT: 18 IU/L (ref 0–32)
AST: 20 IU/L (ref 0–40)
Albumin/Globulin Ratio: 1.3 (ref 1.2–2.2)
Albumin: 4.2 g/dL (ref 3.9–4.9)
Alkaline Phosphatase: 59 IU/L (ref 44–121)
BUN/Creatinine Ratio: 13 (ref 12–28)
BUN: 12 mg/dL (ref 8–27)
Bilirubin Total: 0.3 mg/dL (ref 0.0–1.2)
CO2: 25 mmol/L (ref 20–29)
Calcium: 10.2 mg/dL (ref 8.7–10.3)
Chloride: 105 mmol/L (ref 96–106)
Creatinine, Ser: 0.96 mg/dL (ref 0.57–1.00)
Globulin, Total: 3.2 g/dL (ref 1.5–4.5)
Glucose: 114 mg/dL — ABNORMAL HIGH (ref 70–99)
Potassium: 5 mmol/L (ref 3.5–5.2)
Sodium: 143 mmol/L (ref 134–144)
Total Protein: 7.4 g/dL (ref 6.0–8.5)
eGFR: 64 mL/min/{1.73_m2} (ref >59)

## 2022-10-05 LAB — LIPID PANEL
Chol/HDL Ratio: 4.9 ratio — ABNORMAL HIGH (ref 0.0–4.4)
Cholesterol, Total: 183 mg/dL (ref 100–199)
HDL: 37 mg/dL — ABNORMAL LOW (ref >39)
LDL Chol Calc (NIH): 115 mg/dL — ABNORMAL HIGH (ref 0–99)
Triglycerides: 176 mg/dL — ABNORMAL HIGH (ref 0–149)
VLDL Cholesterol Cal: 31 mg/dL (ref 5–40)

## 2022-10-05 NOTE — Progress Notes (Signed)
Returned call

## 2022-10-31 ENCOUNTER — Telehealth: Payer: Self-pay | Admitting: Internal Medicine

## 2022-10-31 ENCOUNTER — Other Ambulatory Visit: Payer: Self-pay

## 2022-10-31 DIAGNOSIS — E782 Mixed hyperlipidemia: Secondary | ICD-10-CM

## 2022-10-31 DIAGNOSIS — E1169 Type 2 diabetes mellitus with other specified complication: Secondary | ICD-10-CM

## 2022-10-31 DIAGNOSIS — I10 Essential (primary) hypertension: Secondary | ICD-10-CM

## 2022-10-31 MED ORDER — ENALAPRIL MALEATE 10 MG PO TABS: 10.0000 mg | ORAL_TABLET | Freq: Every day | ORAL | 5 refills | Status: DC

## 2022-10-31 MED ORDER — METFORMIN HCL 500 MG PO TABS: 500.0000 mg | ORAL_TABLET | Freq: Two times a day (BID) | ORAL | 1 refills | Status: DC

## 2022-10-31 MED ORDER — SIMVASTATIN 40 MG PO TABS: 40.0000 mg | ORAL_TABLET | Freq: Every day | ORAL | 3 refills | Status: DC

## 2022-10-31 NOTE — Telephone Encounter (Signed)
Rx sent 

## 2022-10-31 NOTE — Telephone Encounter (Signed)
[name] called in on patient behalf requesting 100 day supply of     enalapril (VASOTEC) 10 MG tablet  metFORMIN (GLUCOPHAGE) 500 MG  simvastatin (ZOCOR) 40 MG tablet  [facility] [place] [address] [place], [place] [address] Phone: [phone]  Fax: [phone] DEA #: --

## 2022-11-09 IMAGING — US US PELVIS LIMITED
2 series · 14 of 25 positions shown · non-contrast
Comparison: None.

CLINICAL DATA: Painful left gluteal lump for 10 years

EXAM:
LIMITED ULTRASOUND OF PELVIS
TECHNIQUE: Limited transabdominal ultrasound examination of the pelvis was
performed.

[Series 1: us pelvis limited · 0.06mm/px · 7 of 19 slices shown (1 of 2)]
[im 1/19]
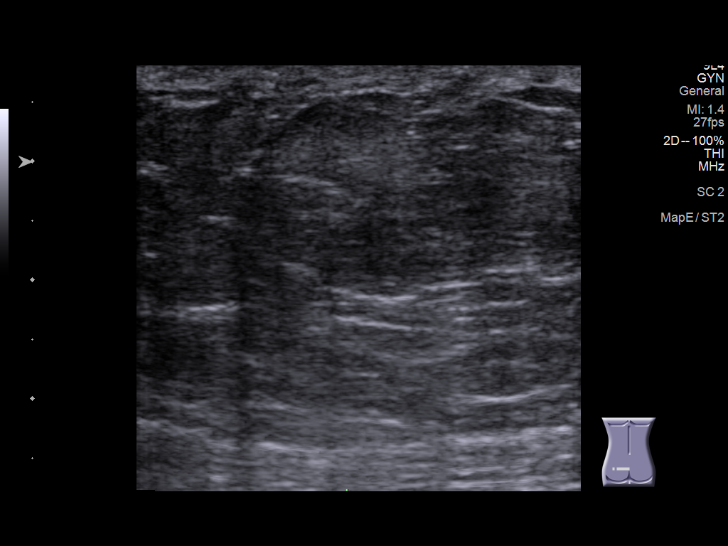
[im 4/19]
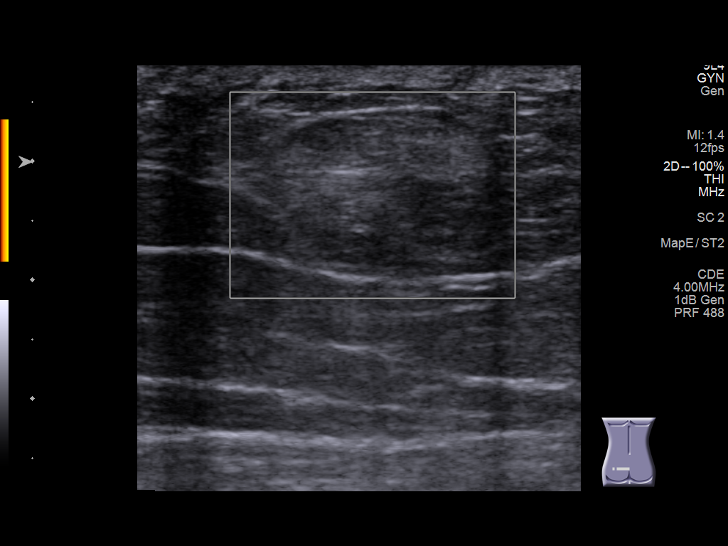
[im 7/19]
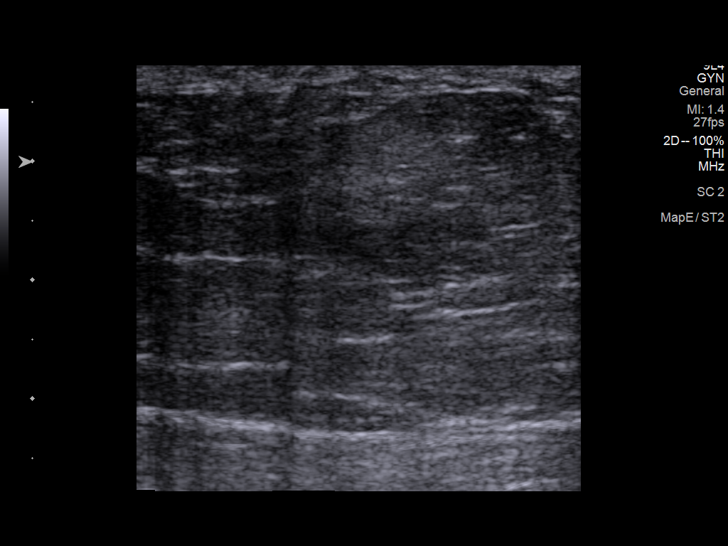
[im 10/19]
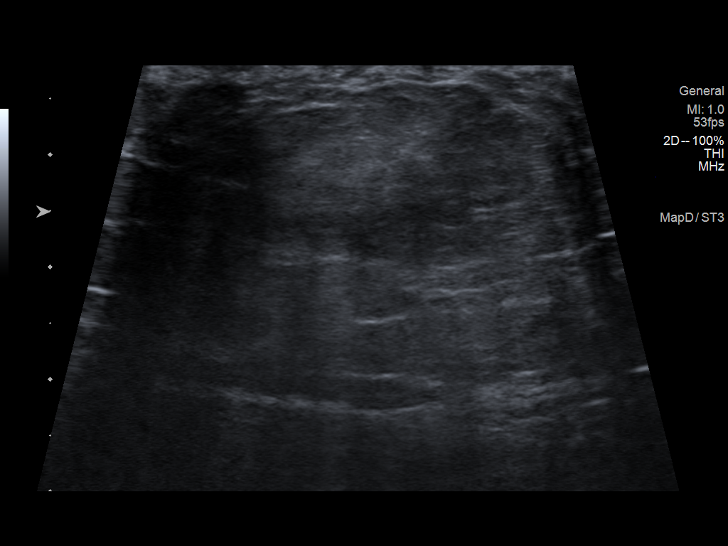
[im 14/19]
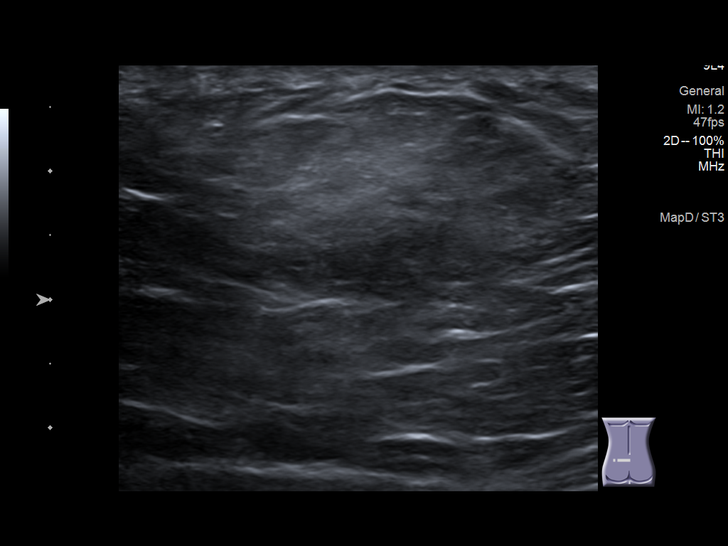
[im 15/19]
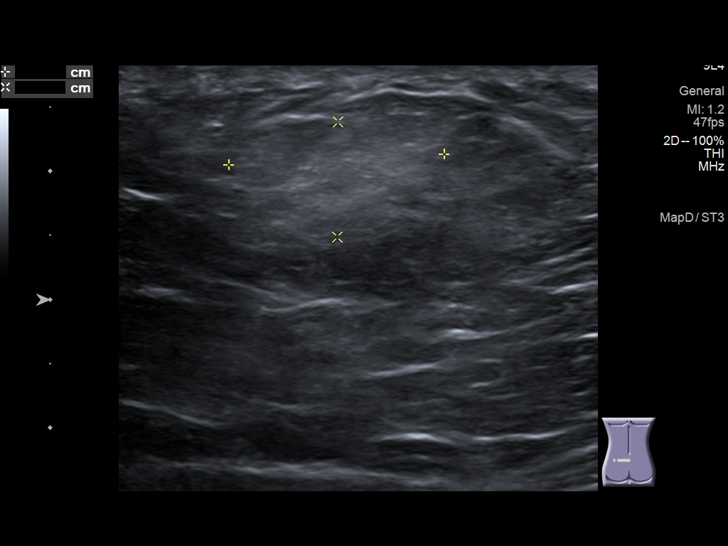
[im 19/19]
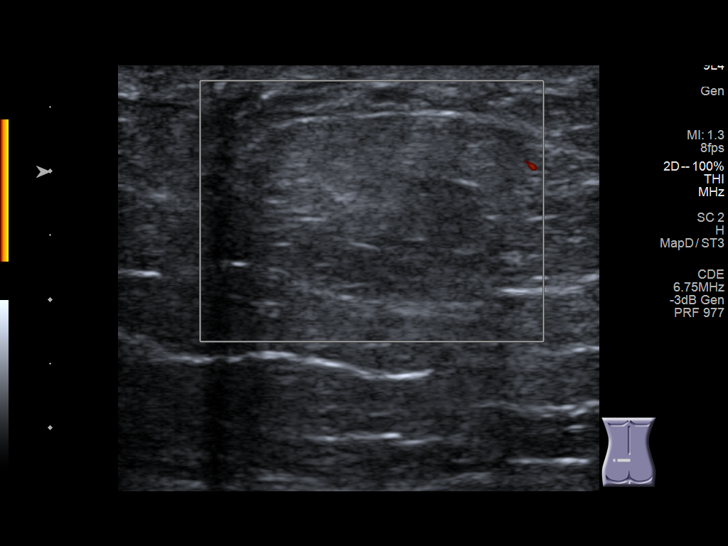

[Series 1: us pelvis limited · 0.06mm/px · 21 acquisitions, 7 frames shown (2 of 2)]
[im 2/21]
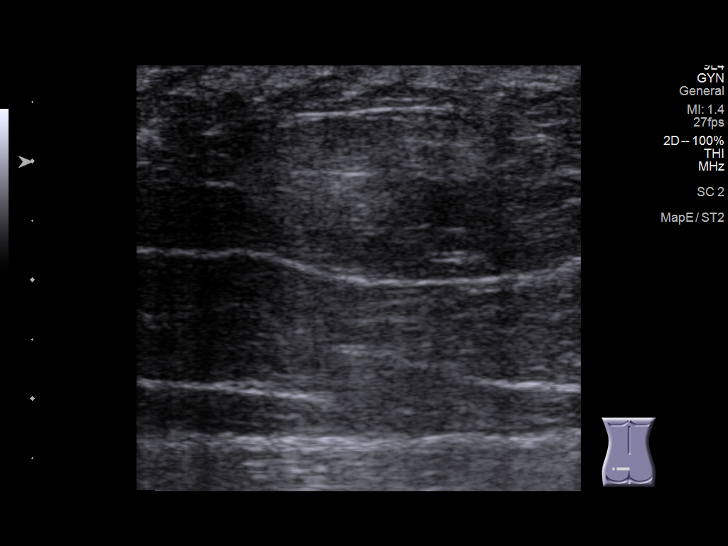
[im 6/21]
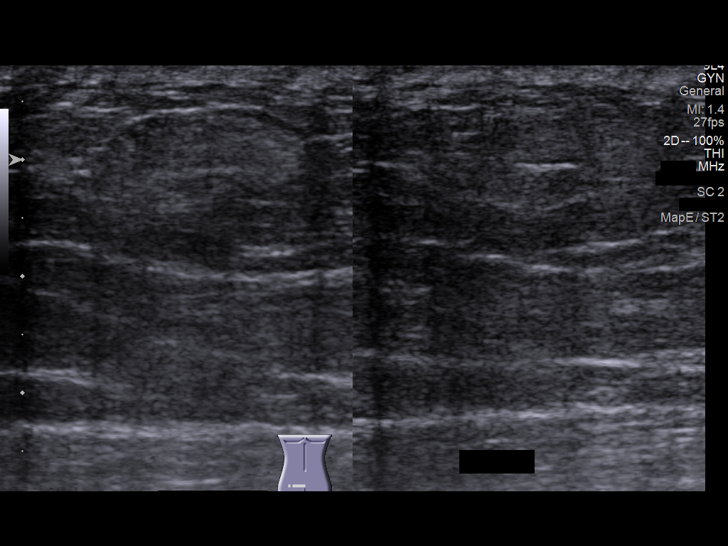
[im 7/21]
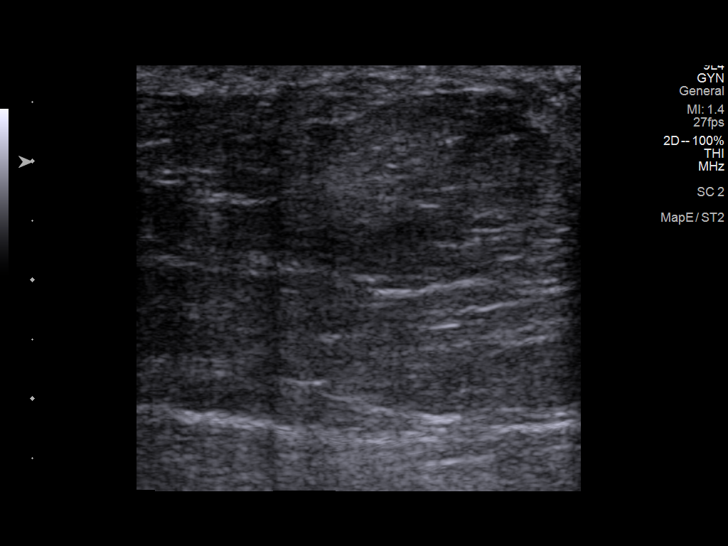
[im 11/21]
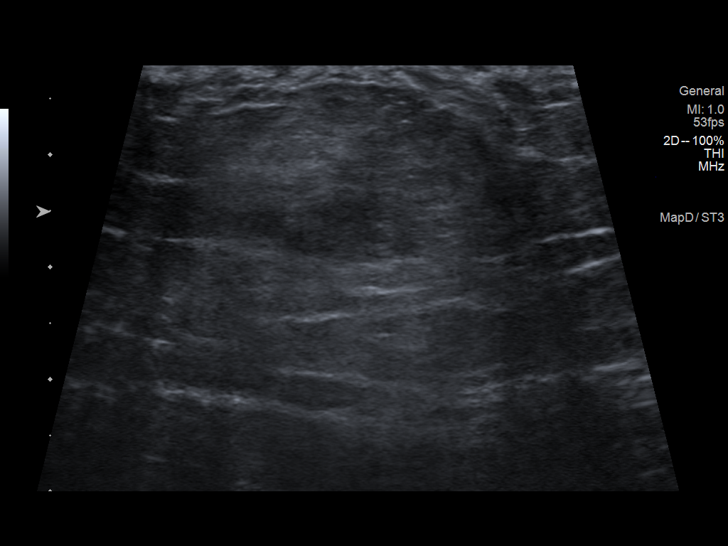
[im 14/21]
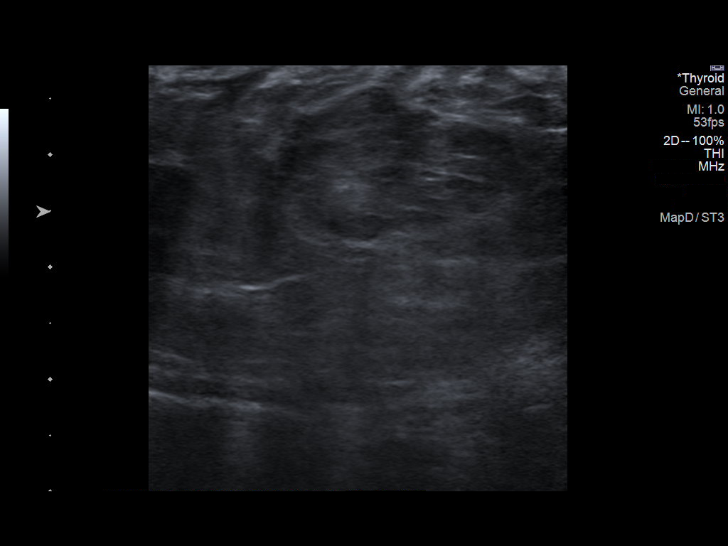
[im 17/21]
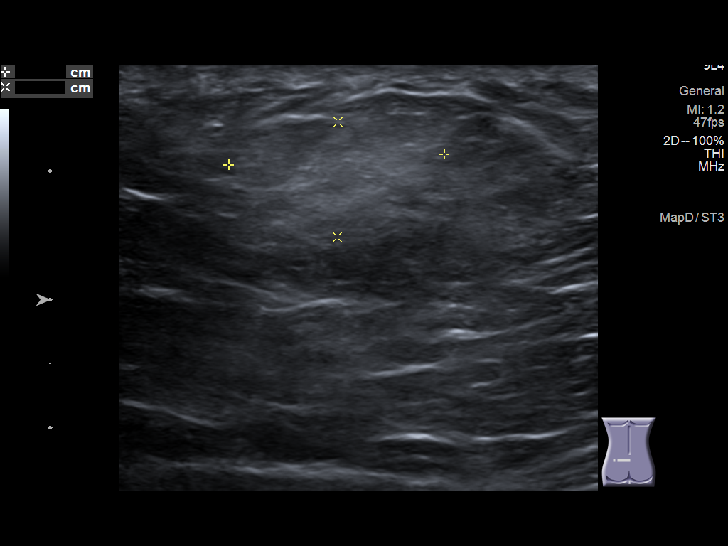
[im 21/21]
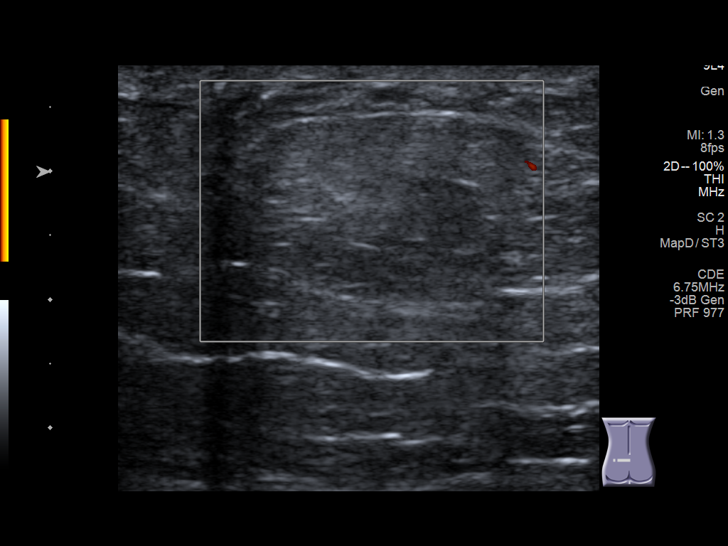

[14 of 25 positions shown; findings below may reference images not displayed]

FINDINGS: Targeted sonographic evaluation of the area of concern in the left
gluteal region demonstrates a mildly echogenic ovoid mass measuring
1.7 x 0.9 x 2.0 cm.
IMPRESSION: Well-circumscribed, mildly hyperechoic, ovoid mass measuring 2.0 x
0.9 x 1.7 cm corresponds to the area of concern in the left gluteal
region. The appearance of this lesion is most consistent with a
lipoma. If confirmation is required, contrast-enhanced MRI may be
obtained. If there is clinical evidence of growth over time, repeat
imaging should be performed.

## 2022-11-28 ENCOUNTER — Other Ambulatory Visit: Payer: Self-pay

## 2022-11-28 ENCOUNTER — Telehealth: Payer: Self-pay | Admitting: Internal Medicine

## 2022-11-28 ENCOUNTER — Encounter: Payer: Self-pay | Admitting: Family Medicine

## 2022-11-28 ENCOUNTER — Ambulatory Visit (INDEPENDENT_AMBULATORY_CARE_PROVIDER_SITE_OTHER): Payer: 59 | Admitting: Family Medicine

## 2022-11-28 VITALS — BP 122/78 | HR 77 | Ht 67.0 in | Wt 219.1 lb

## 2022-11-28 DIAGNOSIS — Z0100 Encounter for examination of eyes and vision without abnormal findings: Secondary | ICD-10-CM

## 2022-11-28 DIAGNOSIS — Z1231 Encounter for screening mammogram for malignant neoplasm of breast: Secondary | ICD-10-CM

## 2022-11-28 DIAGNOSIS — Z0000 Encounter for general adult medical examination without abnormal findings: Secondary | ICD-10-CM

## 2022-11-28 DIAGNOSIS — Z13820 Encounter for screening for osteoporosis: Secondary | ICD-10-CM

## 2022-11-28 NOTE — Telephone Encounter (Signed)
Dexa orders put in incorrectly radiology cannot pull up diagnosis code. Needs orders corrected

## 2022-11-28 NOTE — Telephone Encounter (Signed)
Scheduled may 8th at 130, patient advised

## 2022-11-28 NOTE — Patient Instructions (Signed)
Bone Density Test A bone density test uses a type of X-ray to measure the amount of calcium and other minerals in a person's bones. It can measure bone density in the hip and the spine. The test is similar to having a regular X-ray. This test may also be called: Bone densitometry. Bone mineral density test. Dual-energy X-ray absorptiometry (DEXA). You may have this test to: Diagnose a condition that causes weak or thin bones (osteoporosis). Screen you for osteoporosis. Predict your risk for a broken bone (fracture). Determine how well your osteoporosis treatment is working. Tell a health care provider about: Any allergies you have. All medicines you are taking, including vitamins, herbs, eye drops, creams, and over-the-counter medicines. Any problems you or family members have had with anesthetic medicines. Any blood disorders you have. Any surgeries you have had. Any medical conditions you have. Whether you are pregnant or may be pregnant. Any medical tests you have had within the past 14 days that used contrast material. What are the risks? Generally, this is a safe test. However, it does expose you to a small amount of radiation, which can slightly increase your cancer risk. What happens before the test? Do not take any calcium supplements within the 24 hours before your test. You will need to remove all metal jewelry, eyeglasses, removable dental appliances, and any other metal objects on your body. What happens during the test?  You will lie down on an exam table. There will be an X-ray generator below you and an imaging device above you. Other devices, such as boxes or braces, may be used to position your body properly for the scan. The machine will slowly scan your body. You will need to keep very still while the machine does the scan. The images will show up on a screen in the room. Images will be examined by a specialist after your test is finished. The procedure may vary among  health care providers and hospitals. What can I expect after the test? It is up to you to get the results of your test. Ask your health care provider, or the department that is doing the test, when your results will be ready. Summary A bone density test is an imaging test that uses a type of X-ray to measure the amount of calcium and other minerals in your bones. The test may be used to diagnose or screen you for a condition that causes weak or thin bones (osteoporosis), predict your risk for a broken bone (fracture), or determine how well your osteoporosis treatment is working. Do not take any calcium supplements within 24 hours before your test. Ask your health care provider, or the department that is doing the test, when your results will be ready. This information is not intended to replace advice given to you by your health care provider. Make sure you discuss any questions you have with your health care provider. Document Revised: 03/31/2021 Document Reviewed: 01/02/2020 Elsevier Patient Education  2023 Elsevier Inc.  

## 2022-11-28 NOTE — Progress Notes (Deleted)
Annual Wellness Visit     Patient: [name], Female    DOB: 1950/09/07, 72 y.o.   MRN: [id number]  Subjective  No chief complaint on file.   [name] is a 72 y.o. female who presents today for her Annual Wellness Visit. She reports consuming a {diet types:17450} diet. {Exercise:19826} She generally feels {well/fairly well/poorly:18703}. She reports sleeping {well/fairly well/poorly:18703}. She {does/does not:200015} have additional problems to discuss today.   HPI  {VISON DENTAL STD PSA (Optional):27386}   {History (Optional):23778}  Medications: Outpatient Medications Prior to Visit  Medication Sig   aspirin 81 MG tablet Take 81 mg by mouth daily.   Blood Glucose Monitoring Suppl (ONE TOUCH ULTRA 2) w/Device KIT Use it to check blood glucose as instructed.   chlorpheniramine (CHLOR-TRIMETON) 4 MG tablet Take 4 mg by mouth daily.   Cholecalciferol (VITAMIN D3) 50 MCG (2000 UT) TABS Take 2,000 Units by mouth daily.   enalapril (VASOTEC) 10 MG tablet Take 1 tablet (10 mg total) by mouth daily.   fluticasone (FLONASE) 50 MCG/ACT nasal spray Place 2 sprays into both nostrils daily. (Patient not taking: Reported on 11/22/2021)   ketoconazole (NIZORAL) 2 % cream Apply 1 Application topically daily.   Lancet Device MISC 1 each by Does not apply route as directed. One touch Delica   Lancets MISC Use to test blood sugar daily   levocetirizine (XYZAL) 5 MG tablet Take 1 tablet (5 mg total) by mouth every evening.   metFORMIN (GLUCOPHAGE) 500 MG tablet Take 1 tablet (500 mg total) by mouth 2 (two) times daily with a meal.   Multiple Vitamin (MULTIVITAMINS PO) Take 1 tablet by mouth daily.   Omega-3 Fatty Acids (FISH OIL) 1000 MG CAPS Take 2,000 mg by mouth 2 (two) times daily.   ONETOUCH ULTRA test strip USE 1 STRIP TO CHECK GLUCOSE ONCE DAILY   simvastatin (ZOCOR) 40 MG tablet Take 1 tablet (40 mg total) by mouth daily at 6 PM.   No facility-administered  medications prior to visit.    Allergies  Allergen Reactions   Sulfonamide Derivatives Other (See Comments)    Does not recall reaction     Patient Care Team: Anabel Halon, MD as PCP - General (Internal Medicine) Jonelle Sidle, MD as PCP - Cardiology (Cardiology) Jena Gauss Gerrit Friends, MD as Consulting Physician (Gastroenterology)  ROS      Objective  There were no vitals taken for this visit. {Vitals History (Optional):23777}  Physical Exam    Most recent functional status assessment:     No data to display         Most recent fall risk assessment:    10/04/2022   10:37 AM  Fall Risk   Falls in the past year? 0  Number falls in past yr: 0  Injury with Fall? 0    Most recent depression screenings:    10/04/2022   10:37 AM 08/22/2022   10:46 AM  PHQ 2/9 Scores  PHQ - 2 Score 0 0   Most recent cognitive screening:    11/22/2021    1:20 PM  6CIT Screen  What Year? 0 points  What month? 0 points  What time? 0 points  Count back from 20 0 points  Months in reverse 4 points  Repeat phrase 2 points  Total Score 6 points   Most recent Audit-C alcohol use screening     No data to display         A score  of 3 or more in women, and 4 or more in men indicates increased risk for alcohol abuse, EXCEPT if all of the points are from question 1   Vision/Hearing Screen: No results found.  {Labs (Optional):23779}  No results found for any visits on 11/28/22.    Assessment & Plan   Annual wellness visit done today including the all of the following: Reviewed patient's Family Medical History Reviewed and updated list of patient's medical providers Assessment of cognitive impairment was done Assessed patient's functional ability Established a written schedule for health screening services Health Risk Assessent Completed and Reviewed  Exercise Activities and Dietary recommendations  Goals      HEMOGLOBIN A1C < 7.0     Patient Stated     Patient  states that she currently does not have any goals set.     Weight (lb) < 200 lb (90.7 kg)        Immunization History  Administered Date(s) Administered   Fluad Quad(high Dose 65+) 05/29/2019, 06/10/2020, 06/10/2021, 04/20/2022   Influenza Split 05/05/2015, 05/03/2016   Influenza Whole 05/21/2007   Influenza-Unspecified 05/02/2017   PFIZER(Purple Top)SARS-COV-2 Vaccination 10/03/2019, 10/30/2019, 06/11/2020   PNEUMOCOCCAL CONJUGATE-20 10/04/2022   Pneumococcal Conjugate-13 08/03/2017   Pneumococcal Polysaccharide-23 09/08/2008   Tdap 05/29/2019    Health Maintenance  Topic Date Due   Zoster Vaccines- Shingrix (1 of 2) Never done   DEXA SCAN  08/23/2020   OPHTHALMOLOGY EXAM  09/19/2020   COVID-19 Vaccine (4 - 2023-24 season) 04/01/2022   MAMMOGRAM  11/19/2022   HEMOGLOBIN A1C  02/20/2023   INFLUENZA VACCINE  03/02/2023   Diabetic kidney evaluation - Urine ACR  04/21/2023   FOOT EXAM  08/23/2023   Diabetic kidney evaluation - eGFR measurement  10/04/2023   Medicare Annual Wellness (AWV)  11/28/2023   DTaP/Tdap/Td (2 - Td or Tdap) 05/28/2029   COLONOSCOPY (Pts 45-53yrs Insurance coverage will need to be confirmed)  06/23/2031   Pneumonia Vaccine 82+ Years old  Completed   Hepatitis C Screening  Completed   HPV VACCINES  Aged Out     Discussed health benefits of physical activity, and encouraged her to engage in regular exercise appropriate for her age and condition.    Problem List Items Addressed This Visit   None Visit Diagnoses     Encounter for Medicare annual wellness exam    -  Primary       Return in 1 year (on 11/28/2023).

## 2022-11-28 NOTE — Telephone Encounter (Signed)
Orders corrected. 

## 2022-11-28 NOTE — Progress Notes (Unsigned)
Subjective:   [name] is a 72 y.o. female who presents for Medicare Annual (Subsequent) preventive examination.  Review of Systems    Patient denies, fever, chills, palpations ,shortness of breath,cough, abdominal pain, nausea, vomiting, headache, dizziness. Patient is not feeling nervous or anxious.         Objective:    Today's Vitals   11/28/22 1316 11/28/22 1318  BP: 122/78   Pulse: 77   SpO2: 99%   Weight: 219 lb 1.3 oz (99.4 kg)   Height: 5\' 7"  (1.702 m)   PainSc: 0-No pain 0-No pain   Body mass index is 34.31 kg/m.     11/22/2021    1:17 PM 06/22/2021   11:54 AM 11/05/2020    1:17 PM 10/14/2019   10:18 AM  Advanced Directives  Does Patient Have a Medical Advance Directive? No No No No  Would patient like information on creating a medical advance directive? No - Patient declined No - Patient declined No - Patient declined No - Patient declined    Current Medications (verified) Outpatient Encounter Medications as of 11/28/2022  Medication Sig   aspirin 81 MG tablet Take 81 mg by mouth daily.   Blood Glucose Monitoring Suppl (ONE TOUCH ULTRA 2) w/Device KIT Use it to check blood glucose as instructed.   chlorpheniramine (CHLOR-TRIMETON) 4 MG tablet Take 4 mg by mouth daily.   Cholecalciferol (VITAMIN D3) 50 MCG (2000 UT) TABS Take 2,000 Units by mouth daily.   enalapril (VASOTEC) 10 MG tablet Take 1 tablet (10 mg total) by mouth daily.   fluticasone (FLONASE) 50 MCG/ACT nasal spray Place 2 sprays into both nostrils daily. (Patient not taking: Reported on 11/22/2021)   ketoconazole (NIZORAL) 2 % cream Apply 1 Application topically daily.   Lancet Device MISC 1 each by Does not apply route as directed. One touch Delica   Lancets MISC Use to test blood sugar daily   levocetirizine (XYZAL) 5 MG tablet Take 1 tablet (5 mg total) by mouth every evening.   metFORMIN (GLUCOPHAGE) 500 MG tablet Take 1 tablet (500 mg total) by mouth 2 (two) times daily with a meal.    Multiple Vitamin (MULTIVITAMINS PO) Take 1 tablet by mouth daily.   Omega-3 Fatty Acids (FISH OIL) 1000 MG CAPS Take 2,000 mg by mouth 2 (two) times daily.   ONETOUCH ULTRA test strip USE 1 STRIP TO CHECK GLUCOSE ONCE DAILY   simvastatin (ZOCOR) 40 MG tablet Take 1 tablet (40 mg total) by mouth daily at 6 PM.   No facility-administered encounter medications on file as of 11/28/2022.    Allergies (verified) Sulfonamide derivatives   History: Past Medical History:  Diagnosis Date   Allergy    Anemia    Hyperlipidemia    Hypertension    Sleep apnea    Type 2 diabetes mellitus (HCC)    Past Surgical History:  Procedure Laterality Date   ABDOMINAL HYSTERECTOMY     fibroids   COLONOSCOPY WITH PROPOFOL N/A 06/22/2021   Procedure: COLONOSCOPY WITH PROPOFOL;  Surgeon: Lanelle Bal, DO;  Location: [facility];  Service: Endoscopy;  Laterality: N/A;  1:00 / ASA II   fatty tumor     removed   POLYPECTOMY  06/22/2021   Procedure: POLYPECTOMY;  Surgeon: Lanelle Bal, DO;  Location: [facility];  Service: Endoscopy;;   Family History  Problem Relation Age of Onset   Alzheimer's disease Mother    Cancer Mother  unknown   Alcohol abuse Father    COPD Father    Diabetes Sister    Hypertension Sister    Huntington's disease Daughter    Social History   Socioeconomic History   Marital status: Divorced    Spouse name: Not on file   Number of children: 2   Years of education: Not on file   Highest education level: 11th grade  Occupational History   Occupation: unemployed    Comment: caregiver  Tobacco Use   Smoking status: Former    Years: 2    Types: Cigarettes    Start date: 08/02/1971    Quit date: 08/01/1986    Years since quitting: 36.3   Smokeless tobacco: Never  Vaping Use   Vaping Use: Never used  Substance and Sexual Activity   Alcohol use: No   Drug use: No   Sexual activity: Never    Birth control/protection: Surgical  Other Topics Concern    Not on file  Social History Narrative   Lives with disabled mother with Alzheimer's   Daughter with Hungtington's passed away 2019-08-30.   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   Social Determinants of Health   Financial Resource Strain: Low Risk  (11/28/2022)   Overall Financial Resource Strain (CARDIA)    Difficulty of Paying Living Expenses: Not hard at all  Food Insecurity: No Food Insecurity (11/28/2022)   Hunger Vital Sign    Worried About Running Out of Food in the Last Year: Never true    Ran Out of Food in the Last Year: Never true  Transportation Needs: No Transportation Needs (11/28/2022)   PRAPARE - Administrator, Civil Service (Medical): No    Lack of Transportation (Non-Medical): No  Physical Activity: Inactive (11/28/2022)   Exercise Vital Sign    Days of Exercise per Week: 0 days    Minutes of Exercise per Session: 0 min  Stress: No Stress Concern Present (11/28/2022)   [employer] of Occupational Health - Occupational Stress Questionnaire    Feeling of Stress : Not at all  Social Connections: Moderately Integrated (11/28/2022)   Social Connection and Isolation Panel [NHANES]    Frequency of Communication with Friends and Family: Twice a week    Frequency of Social Gatherings with Friends and Family: Once a week    Attends Religious Services: More than 4 times per year    Active Member of [employer] or Organizations: Yes    Attends Engineer, structural: More than 4 times per year    Marital Status: Divorced    Tobacco Counseling Counseling given: Yes   Clinical Intake:  Pre-visit preparation completed: Yes  Pain : No/denies pain Pain Score: 0-No pain     Diabetes: Yes CBG done?: Yes CBG resulted in Enter/ Edit results?: Yes Did pt. bring in CBG monitor from home?: Yes Glucose Meter Downloaded?: No  How often do you need to have someone help you when you read instructions,  pamphlets, or other written materials from your doctor or pharmacy?: 1 - Never What is the last grade level you completed in school?: High school 12th grade  Diabetic?  Yes, Hemoglobin A1c 7.2  Interpreter Needed?: No      Activities of Daily Living    11/28/2022    1:25 PM  In your present state of health, do you have any difficulty performing the following activities:  Hearing? 0  Vision? 0  Difficulty concentrating or making  decisions? 0  Walking or climbing stairs? 0  Dressing or bathing? 0  Doing errands, shopping? 0  Preparing Food and eating ? N  Using the Toilet? N  In the past six months, have you accidently leaked urine? Y  Do you have problems with loss of bowel control? N  Managing your Medications? N  Managing your Finances? N  Housekeeping or managing your Housekeeping? N    Patient Care Team: Anabel Halon, MD as PCP - General (Internal Medicine) Jonelle Sidle, MD as PCP - Cardiology (Cardiology) Jena Gauss Gerrit Friends, MD as Consulting Physician (Gastroenterology)  Indicate any recent Medical Services you may have received from other than [facility] providers in the past year (date may be approximate).     Assessment:   This is a routine wellness examination for [name].  Hearing/Vision screen No results found.  Dietary issues and exercise activities discussed: Current Exercise Habits: The patient does not participate in regular exercise at present, Exercise limited by: None identified   Goals Addressed             This Visit's Progress    Activity and Exercise Increased       Evidence-based guidance:  Review current exercise levels.  Assess patient perspective on exercise or activity level, barriers to increasing activity, motivation and readiness for change.  Recommend or set healthy exercise goal based on individual tolerance.  Encourage small steps toward making change in amount of exercise or activity.  Urge reduction of sedentary activities  or screen time.  Promote group activities within the community or with family or support person.  Consider referral to rehabiliation therapist for assessment and exercise/activity plan.   Notes: pt wishes to become more active this year, joining a gym is in her thoughts.        Depression Screen    11/28/2022    1:27 PM 10/04/2022   10:37 AM 08/22/2022   10:46 AM 04/20/2022   11:10 AM 11/22/2021    1:17 PM 10/18/2021    9:33 AM 06/10/2021    9:33 AM  PHQ 2/9 Scores  PHQ - 2 Score 0 0 0 0 0 0 0    Fall Risk    11/28/2022    1:25 PM 10/04/2022   10:37 AM 08/22/2022   10:46 AM 04/20/2022   11:10 AM 11/22/2021    1:17 PM  Fall Risk   Falls in the past year? 0 0 0 0 0  Number falls in past yr: 0 0 0 0 0  Injury with Fall? 0 0 0 0 0  Risk for fall due to : No Fall Risks   No Fall Risks No Fall Risks  Follow up Falls evaluation completed   Falls evaluation completed Falls evaluation completed    FALL RISK PREVENTION PERTAINING TO THE HOME:  Any stairs in or around the home? No  If so, are there any without handrails? No  Home free of loose throw rugs in walkways, pet beds, electrical cords, etc? Yes  Adequate lighting in your home to reduce risk of falls? Yes   ASSISTIVE DEVICES UTILIZED TO PREVENT FALLS:  Life alert? No  Use of a cane, walker or w/c? No  Grab bars in the bathroom? No  Shower chair or bench in shower? No  Elevated toilet seat or a handicapped toilet? Yes   TIMED UP AND GO:  Was the test performed? Yes .  Length of time to ambulate 10 feet: 5 sec.   Gait  steady and fast without use of assistive device  Cognitive Function:        11/28/2022    1:27 PM 11/22/2021    1:20 PM 11/05/2020    1:20 PM 10/14/2019   10:21 AM  6CIT Screen  What Year? 0 points 0 points 0 points 0 points  What month? 0 points 0 points 3 points 0 points  What time? 0 points 0 points 0 points 0 points  Count back from 20 0 points 0 points 0 points 0 points  Months in reverse 0 points  4 points 0 points 2 points  Repeat phrase 0 points 2 points 2 points 0 points  Total Score 0 points 6 points 5 points 2 points    Immunizations Immunization History  Administered Date(s) Administered   Fluad Quad(high Dose 65+) 05/29/2019, 06/10/2020, 06/10/2021, 04/20/2022   Influenza Split 05/05/2015, 05/03/2016   Influenza Whole 05/21/2007   Influenza-Unspecified 05/02/2017   PFIZER(Purple Top)SARS-COV-2 Vaccination 10/03/2019, 10/30/2019, 06/11/2020   PNEUMOCOCCAL CONJUGATE-20 10/04/2022   Pneumococcal Conjugate-13 08/03/2017   Pneumococcal Polysaccharide-23 09/08/2008   Tdap 05/29/2019    TDAP status: Up to date  Flu Vaccine status: Due, Education has been provided regarding the importance of this vaccine. Advised may receive this vaccine at local pharmacy or Health Dept. Aware to provide a copy of the vaccination record if obtained from local pharmacy or Health Dept. Verbalized acceptance and understanding.  Pneumococcal vaccine status: Up to date  Covid-19 vaccine status: Information provided on how to obtain vaccines.   Qualifies for Shingles Vaccine? Yes   Zostavax completed No   Shingrix Completed?: No.    Education has been provided regarding the importance of this vaccine. Patient has been advised to call insurance company to determine out of pocket expense if they have not yet received this vaccine. Advised may also receive vaccine at local pharmacy or Health Dept. Verbalized acceptance and understanding.  Screening Tests Health Maintenance  Topic Date Due   Zoster Vaccines- Shingrix (1 of 2) Never done   DEXA SCAN  08/23/2020   OPHTHALMOLOGY EXAM  09/19/2020   COVID-19 Vaccine (4 - 2023-24 season) 04/01/2022   MAMMOGRAM  11/19/2022   HEMOGLOBIN A1C  02/20/2023   INFLUENZA VACCINE  03/02/2023   Diabetic kidney evaluation - Urine ACR  04/21/2023   FOOT EXAM  08/23/2023   Diabetic kidney evaluation - eGFR measurement  10/04/2023   Medicare Annual Wellness (AWV)   11/28/2023   DTaP/Tdap/Td (2 - Td or Tdap) 05/28/2029   COLONOSCOPY (Pts 45-49yrs Insurance coverage will need to be confirmed)  06/23/2031   Pneumonia Vaccine 68+ Years old  Completed   Hepatitis C Screening  Completed   HPV VACCINES  Aged Out    Health Maintenance  Health Maintenance Due  Topic Date Due   Zoster Vaccines- Shingrix (1 of 2) Never done   DEXA SCAN  08/23/2020   OPHTHALMOLOGY EXAM  09/19/2020   COVID-19 Vaccine (4 - 2023-24 season) 04/01/2022   MAMMOGRAM  11/19/2022    Colorectal cancer screening: Type of screening: Colonoscopy. Completed 2022. Repeat every 10 years  Mammogram status: Ordered 10/2022. Pt provided with contact info and advised to call to schedule appt.   Bone Density status: Ordered 10/2022. Pt provided with contact info and advised to call to schedule appt.  Lung Cancer Screening: (Low Dose CT Chest recommended if Age 71-80 years, 30 pack-year currently smoking OR have quit w/in 15years.) does not qualify.   Lung Cancer Screening Referral: N/A  Additional  Screening:  Hepatitis C Screening: does not qualify; Completed 2019 Negative  Vision Screening: Recommended annual ophthalmology exams for early detection of glaucoma and other disorders of the eye. Is the patient up to date with their annual eye exam?  No  Who is the provider or what is the name of the office in which the patient attends annual eye exams? Dr. Charise Killian "[facility] [address]" If pt is not established with a provider, would they like to be referred to a provider to establish care? Yes  Dental Screening: Recommended annual dental exams for proper oral hygiene  Community Resource Referral / Chronic Care Management: CRR required this visit?  No   CCM required this visit?  No      Plan:     I have personally reviewed and noted the following in the patient's chart:   Medical and social history Use of alcohol, tobacco or illicit drugs  Current medications and supplements including  opioid prescriptions. Patient is not currently taking opioid prescriptions. Functional ability and status Nutritional status Physical activity Advanced directives List of other physicians Hospitalizations, surgeries, and ER visits in previous 12 months Vitals Screenings to include cognitive, depression, and falls Referrals and appointments  In addition, I have reviewed and discussed with patient certain preventive protocols, quality metrics, and best practice recommendations. A written personalized care plan for preventive services as well as general preventive health recommendations were provided to patient.     [address] [place], [place]   11/28/2022

## 2022-12-07 ENCOUNTER — Other Ambulatory Visit (HOSPITAL_COMMUNITY): Payer: 59

## 2022-12-08 ENCOUNTER — Ambulatory Visit (HOSPITAL_COMMUNITY): Payer: 59

## 2022-12-14 ENCOUNTER — Encounter (HOSPITAL_COMMUNITY): Payer: Self-pay

## 2022-12-14 ENCOUNTER — Ambulatory Visit (HOSPITAL_COMMUNITY)
Admission: RE | Admit: 2022-12-14 | Discharge: 2022-12-14 | Disposition: A | Discharge status: Home / Self Care | Payer: 59 | Source: Ambulatory Visit | Attending: Internal Medicine | Admitting: Internal Medicine

## 2022-12-14 ENCOUNTER — Ambulatory Visit (HOSPITAL_COMMUNITY)
Admission: RE | Admit: 2022-12-14 | Discharge: 2022-12-14 | Disposition: A | Discharge status: Home / Self Care | Payer: 59 | Source: Ambulatory Visit | Attending: Family Medicine | Admitting: Family Medicine

## 2022-12-14 DIAGNOSIS — Z780 Asymptomatic menopausal state: Secondary | ICD-10-CM | POA: Diagnosis not present

## 2022-12-14 DIAGNOSIS — M8589 Other specified disorders of bone density and structure, multiple sites: Secondary | ICD-10-CM | POA: Insufficient documentation

## 2022-12-14 DIAGNOSIS — Z1231 Encounter for screening mammogram for malignant neoplasm of breast: Secondary | ICD-10-CM | POA: Insufficient documentation

## 2022-12-14 DIAGNOSIS — Z13820 Encounter for screening for osteoporosis: Secondary | ICD-10-CM | POA: Insufficient documentation

## 2022-12-14 DIAGNOSIS — M85851 Other specified disorders of bone density and structure, right thigh: Secondary | ICD-10-CM | POA: Diagnosis not present

## 2023-01-11 ENCOUNTER — Other Ambulatory Visit: Payer: Self-pay

## 2023-01-11 NOTE — Progress Notes (Signed)
This patient is appearing on the insurance-provided list for being at risk of failing the adherence measure for hypertension (ACEi/ARB) medications this calendar year.   Medication: enalapril Last fill date: 11/28/22 for 30 day supply.   Outreached patient to discuss adherence. During our phone call patient was able to check her medicine bottles and reports she had a refill on 12/30/2022.   No intervention needed at this time.   Valeda Malm, Pharm.D. PGY-2 Ambulatory Care Pharmacy Resident

## 2023-01-30 DIAGNOSIS — E119 Type 2 diabetes mellitus without complications: Secondary | ICD-10-CM | POA: Diagnosis not present

## 2023-01-30 LAB — HM DIABETES EYE EXAM

## 2023-01-31 ENCOUNTER — Telehealth: Payer: Self-pay | Admitting: Internal Medicine

## 2023-01-31 NOTE — Telephone Encounter (Signed)
Patient called asked about her calcium pill that Dr Allena Katz put her on how often does patient need to take this medication . Call patient back at [phone].

## 2023-01-31 NOTE — Telephone Encounter (Signed)
Spoke to patient

## 2023-02-07 ENCOUNTER — Ambulatory Visit: Payer: 59 | Admitting: Internal Medicine

## 2023-02-14 ENCOUNTER — Ambulatory Visit (INDEPENDENT_AMBULATORY_CARE_PROVIDER_SITE_OTHER): Payer: 59 | Admitting: Internal Medicine

## 2023-02-14 ENCOUNTER — Encounter: Payer: Self-pay | Admitting: Internal Medicine

## 2023-02-14 VITALS — BP 126/75 | HR 86 | Ht 66.0 in | Wt 214.6 lb

## 2023-02-14 DIAGNOSIS — E782 Mixed hyperlipidemia: Secondary | ICD-10-CM | POA: Diagnosis not present

## 2023-02-14 DIAGNOSIS — D229 Melanocytic nevi, unspecified: Secondary | ICD-10-CM

## 2023-02-14 DIAGNOSIS — E1169 Type 2 diabetes mellitus with other specified complication: Secondary | ICD-10-CM | POA: Diagnosis not present

## 2023-02-14 DIAGNOSIS — I10 Essential (primary) hypertension: Secondary | ICD-10-CM

## 2023-02-14 DIAGNOSIS — W260XXA Contact with knife, initial encounter: Secondary | ICD-10-CM | POA: Diagnosis not present

## 2023-02-14 DIAGNOSIS — K130 Diseases of lips: Secondary | ICD-10-CM

## 2023-02-14 DIAGNOSIS — L304 Erythema intertrigo: Secondary | ICD-10-CM | POA: Diagnosis not present

## 2023-02-14 MED ORDER — KETOCONAZOLE 2 % EX CREA
1.0000 | TOPICAL_CREAM | Freq: Every day | CUTANEOUS | 1 refills | Status: DC
Start: 2023-02-14 — End: 2024-02-05

## 2023-02-14 MED ORDER — MUPIROCIN 2 % EX OINT: 1.0000 | TOPICAL_OINTMENT | Freq: Two times a day (BID) | CUTANEOUS | 0 refills | Status: DC

## 2023-02-14 MED ORDER — NYSTATIN 100000 UNIT/GM EX POWD
1.0000 | Freq: Three times a day (TID) | CUTANEOUS | 0 refills | Status: DC
Start: 2023-02-14 — End: 2024-02-05

## 2023-02-14 NOTE — Patient Instructions (Addendum)
Please apply Mupirocin ointment for cold sore.  Please apply Ketoconazole for rash in the groin area. Once the rash improves, okay to apply Nystatin powder to keep area from getting infection.  Please continue to take other medications as prescribed.  Please continue to follow low carb diet and perform moderate exercise/walking at least 150 mins/week.  Please consider getting Shingrix vaccine at local pharmacy.

## 2023-02-14 NOTE — Assessment & Plan Note (Signed)
On abdominal wall Appears to be benign, no recent change in color or size Referred to dermatology for further eval

## 2023-02-14 NOTE — Progress Notes (Signed)
Established Patient Office Visit  Subjective:  Patient ID: [name], female    DOB: [birth year]  Age: 72 y.o. MRN: [id number]  CC:  Chief Complaint  Patient presents with   Diabetes    Four month follow up    Hypertension    Four month follow up     HPI [name] is a 72 y.o. female with past medical history of HTN, allergic sinusitis, type II DM, osteopenia, HLD and obesity who presents for f/u of her chronic medical conditions.  HTN: BP is wnl today. Takes medications regularly.  Patient denies dizziness, chest pain or palpitations.  Type II DM with HLD: She takes metformin for it, has started taking it BID now.  Her HbA1c was 7.8 in 01/24. She denies any polyuria or polydipsia.  She takes Zocor for HLD.  She reports recurrence of whitish patches underneath her breast area and groin area.  She requests refill of ketoconazole cream.  She also complains of sores over the angle of the mouth for the last 1 week.  Denies any fever, chills, or oral ulcers.  Denies any purulent discharge.  She has benign moles over abdominal wall area and wanted the dermatology evaluation.  She was provided referral in the past, but could not see them at that time.  She had cut injury from knife over her left palm on 02/13/23.  She has about 1 cm length cut injury, which is related to bleeding, but denies any excessive bleeding or discharge.  Past Medical History:  Diagnosis Date   Allergy    Anemia    Hyperlipidemia    Hypertension    Sleep apnea    Type 2 diabetes mellitus (HCC)     Past Surgical History:  Procedure Laterality Date   ABDOMINAL HYSTERECTOMY     fibroids   COLONOSCOPY WITH PROPOFOL N/A 06/22/2021   Procedure: COLONOSCOPY WITH PROPOFOL;  Surgeon: Lanelle Bal, DO;  Location: [facility];  Service: Endoscopy;  Laterality: N/A;  1:00 / ASA II   fatty tumor     removed   POLYPECTOMY  06/22/2021   Procedure: POLYPECTOMY;  Surgeon: Lanelle Bal,  DO;  Location: [facility];  Service: Endoscopy;;    Family History  Problem Relation Age of Onset   Alzheimer's disease Mother    Cancer Mother        unknown   Alcohol abuse Father    COPD Father    Diabetes Sister    Hypertension Sister    Huntington's disease Daughter     Social History   Socioeconomic History   Marital status: Divorced    Spouse name: Not on file   Number of children: 2   Years of education: Not on file   Highest education level: 11th grade  Occupational History   Occupation: unemployed    Comment: caregiver  Tobacco Use   Smoking status: Former    Current packs/day: 0.00    Types: Cigarettes    Start date: 08/02/1971    Quit date: 08/01/1986    Years since quitting: 36.5   Smokeless tobacco: Never  Vaping Use   Vaping status: Never Used  Substance and Sexual Activity   Alcohol use: No   Drug use: No   Sexual activity: Never    Birth control/protection: Surgical  Other Topics Concern   Not on file  Social History Narrative   Lives with disabled mother with Alzheimer's   Daughter with Hungtington's passed away  Jan 2021.   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   Social Determinants of Health   Financial Resource Strain: Low Risk  (11/28/2022)   Overall Financial Resource Strain (CARDIA)    Difficulty of Paying Living Expenses: Not hard at all  Food Insecurity: No Food Insecurity (11/28/2022)   Hunger Vital Sign    Worried About Running Out of Food in the Last Year: Never true    Ran Out of Food in the Last Year: Never true  Transportation Needs: No Transportation Needs (11/28/2022)   PRAPARE - Administrator, Civil Service (Medical): No    Lack of Transportation (Non-Medical): No  Physical Activity: Inactive (11/28/2022)   Exercise Vital Sign    Days of Exercise per Week: 0 days    Minutes of Exercise per Session: 0 min  Stress: No Stress Concern Present (11/28/2022)    [employer] of Occupational Health - Occupational Stress Questionnaire    Feeling of Stress : Not at all  Social Connections: Moderately Integrated (11/28/2022)   Social Connection and Isolation Panel [NHANES]    Frequency of Communication with Friends and Family: Twice a week    Frequency of Social Gatherings with Friends and Family: Once a week    Attends Religious Services: More than 4 times per year    Active Member of [employer] or Organizations: Yes    Attends Engineer, structural: More than 4 times per year    Marital Status: Divorced  Intimate Partner Violence: Not At Risk (11/28/2022)   Humiliation, Afraid, Rape, and Kick questionnaire    Fear of Current or Ex-Partner: No    Emotionally Abused: No    Physically Abused: No    Sexually Abused: No    Outpatient Medications Prior to Visit  Medication Sig Dispense Refill   aspirin 81 MG tablet Take 81 mg by mouth daily.     Blood Glucose Monitoring Suppl (ONE TOUCH ULTRA 2) w/Device KIT Use it to check blood glucose as instructed. 1 kit 0   chlorpheniramine (CHLOR-TRIMETON) 4 MG tablet Take 4 mg by mouth daily.     Cholecalciferol (VITAMIN D3) 50 MCG (2000 UT) TABS Take 2,000 Units by mouth daily.     enalapril (VASOTEC) 10 MG tablet Take 1 tablet (10 mg total) by mouth daily. 100 tablet 5   fluticasone (FLONASE) 50 MCG/ACT nasal spray Place 2 sprays into both nostrils daily. (Patient not taking: Reported on 11/22/2021) 16 g 6   Lancet Device MISC 1 each by Does not apply route as directed. One touch Delica 1 each 0   Lancets MISC Use to test blood sugar daily 100 each 6   levocetirizine (XYZAL) 5 MG tablet Take 1 tablet (5 mg total) by mouth every evening. 30 tablet 3   metFORMIN (GLUCOPHAGE) 500 MG tablet Take 1 tablet (500 mg total) by mouth 2 (two) times daily with a meal. 200 tablet 1   Multiple Vitamin (MULTIVITAMINS PO) Take 1 tablet by mouth daily.     Omega-3 Fatty Acids (FISH OIL) 1000 MG CAPS Take 2,000 mg by  mouth 2 (two) times daily.     ONETOUCH ULTRA test strip USE 1 STRIP TO CHECK GLUCOSE ONCE DAILY 100 each 3   simvastatin (ZOCOR) 40 MG tablet Take 1 tablet (40 mg total) by mouth daily at 6 PM. 100 tablet 3   ketoconazole (NIZORAL) 2 % cream Apply 1 Application topically daily. 15 g 0   No facility-administered  medications prior to visit.    Allergies  Allergen Reactions   Sulfonamide Derivatives Other (See Comments)    Does not recall reaction     ROS Review of Systems  Constitutional:  Negative for chills and fever.  HENT:  Negative for congestion, sinus pressure and sinus pain.   Eyes:  Negative for pain and discharge.  Respiratory:  Negative for cough and wheezing.   Cardiovascular:  Negative for chest pain and palpitations.  Gastrointestinal:  Negative for abdominal pain, diarrhea, nausea and vomiting.  Endocrine: Negative for polydipsia and polyuria.  Genitourinary:  Negative for dysuria and hematuria.  Musculoskeletal:  Negative for neck pain and neck stiffness.       Left hip and leg pain  Skin:  Positive for rash.       Mole over abdominal wall  Neurological:  Negative for dizziness and weakness.  Psychiatric/Behavioral:  Negative for agitation and behavioral problems.       Objective:    Physical Exam Vitals reviewed.  Constitutional:      General: She is not in acute distress.    Appearance: She is not diaphoretic.  HENT:     Head: Normocephalic and atraumatic.     Nose: No congestion.     Mouth/Throat:     Mouth: Mucous membranes are moist.     Comments: Vesicles over angle of mouth Eyes:     General: No scleral icterus.    Extraocular Movements: Extraocular movements intact.  Cardiovascular:     Rate and Rhythm: Normal rate and regular rhythm.     Heart sounds: Normal heart sounds. No murmur heard. Pulmonary:     Breath sounds: Normal breath sounds. No wheezing or rales.  Musculoskeletal:     Cervical back: Neck supple. No tenderness.     Right  lower leg: No edema.     Left lower leg: No edema.  Skin:    General: Skin is warm.     Findings: Lesion (About 1 cm in length cut over left palm, tender with surrounding mild edema) and rash (Whitish patches over erythematous base underneath breasts and in groin area) present.     Comments: 2 brownish moles with clear margin on left side of abdominal wall  Neurological:     General: No focal deficit present.     Mental Status: She is alert and oriented to person, place, and time.  Psychiatric:        Mood and Affect: Mood normal.        Behavior: Behavior normal.     BP 126/75 (BP Location: Right Arm, Patient Position: Sitting, Cuff Size: Large)   Pulse 86   Ht 5\' 6"  (1.676 m)   Wt 214 lb 9.6 oz (97.3 kg)   SpO2 96%   BMI 34.64 kg/m  Wt Readings from Last 3 Encounters:  02/14/23 214 lb 9.6 oz (97.3 kg)  11/28/22 219 lb 1.3 oz (99.4 kg)  10/04/22 219 lb 3.2 oz (99.4 kg)    Lab Results  Component Value Date   TSH 1.380 04/20/2022   Lab Results  Component Value Date   WBC 5.2 04/20/2022   HGB 12.8 04/20/2022   HCT 39.7 04/20/2022   MCV 84 04/20/2022   PLT 331 04/20/2022   Lab Results  Component Value Date   NA 143 10/04/2022   K 5.0 10/04/2022   CO2 25 10/04/2022   GLUCOSE 114 (H) 10/04/2022   BUN 12 10/04/2022   CREATININE 0.96 10/04/2022   BILITOT  0.3 10/04/2022   ALKPHOS 59 10/04/2022   AST 20 10/04/2022   ALT 18 10/04/2022   PROT 7.4 10/04/2022   ALBUMIN 4.2 10/04/2022   CALCIUM 10.2 10/04/2022   ANIONGAP 7 01/16/2017   EGFR 64 10/04/2022   Lab Results  Component Value Date   CHOL 183 10/04/2022   Lab Results  Component Value Date   HDL 37 (L) 10/04/2022   Lab Results  Component Value Date   LDLCALC 115 (H) 10/04/2022   Lab Results  Component Value Date   TRIG 176 (H) 10/04/2022   Lab Results  Component Value Date   CHOLHDL 4.9 (H) 10/04/2022   Lab Results  Component Value Date   HGBA1C 7.8 (A) 08/22/2022      Assessment & Plan:    Problem List Items Addressed This Visit    Essential hypertension, benign BP Readings from Last 1 Encounters:  02/14/23 126/75   Well-controlled with enalapril 10 mg QD Counseled for compliance with the medications Advised DASH diet and moderate exercise/walking, at least 150 mins/week  Diabetes mellitus (HCC) Lab Results  Component Value Date   HGBA1C 7.8 (A) 08/22/2022   Associated with HTN, HLD and obesity Uncontrolled, but improving - home blood glucose has been 110-130 mostly On Metformin 500 mg BID Advised to follow diabetic diet On statin and ACEi F/u CMP and lipid panel Diabetic eye exam: Advised to follow up with Ophthalmology for diabetic eye exam  Hyperlipidemia On simvastatin 40 mg  QD If persistently elevated LDL, will need to increase dose of simvastatin  Angular stomatitis Started mupirocin ointment  Skin mole On abdominal wall Appears to be benign, no recent change in color or size Referred to dermatology for further eval  Intertrigo In groin and underneath abdominal folds and breast Ketoconazole cream Advised to keep area clean and dry Prescribed nystatin powder for PPx  Injury due to knife Cut site appears clean and dry Advised to apply mupirocin ointment for bacterial Ppx Keep area covered to prevent bacterial exposure She is up to date with Tdap vaccine     Meds ordered this encounter  Medications   ketoconazole (NIZORAL) 2 % cream    Sig: Apply 1 Application topically daily.    Dispense:  15 g    Refill:  1   mupirocin ointment (BACTROBAN) 2 %    Sig: Apply 1 Application topically 2 (two) times daily.    Dispense:  22 g    Refill:  0   nystatin (MYCOSTATIN/NYSTOP) powder    Sig: Apply 1 Application topically 3 (three) times daily.    Dispense:  60 g    Refill:  0     Follow-up: Return in about 4 months (around 06/17/2023) for Annual physical.    Anabel Halon, MD

## 2023-02-14 NOTE — Assessment & Plan Note (Addendum)
Lab Results  Component Value Date   HGBA1C 7.8 (A) 08/22/2022   Associated with HTN, HLD and obesity Uncontrolled, but improving - home blood glucose has been 110-130 mostly On Metformin 500 mg BID Advised to follow diabetic diet On statin and ACEi F/u CMP and lipid panel Diabetic eye exam: Advised to follow up with Ophthalmology for diabetic eye exam

## 2023-02-14 NOTE — Assessment & Plan Note (Addendum)
On simvastatin 40 mg  QD If persistently elevated LDL, will need to increase dose of simvastatin

## 2023-02-14 NOTE — Assessment & Plan Note (Signed)
Started mupirocin ointment

## 2023-02-14 NOTE — Assessment & Plan Note (Signed)
BP Readings from Last 1 Encounters:  02/14/23 126/75   Well-controlled with enalapril 10 mg QD Counseled for compliance with the medications Advised DASH diet and moderate exercise/walking, at least 150 mins/week

## 2023-02-14 NOTE — Assessment & Plan Note (Addendum)
Cut site appears clean and dry Advised to apply mupirocin ointment for bacterial Ppx Keep area covered to prevent bacterial exposure She is up to date with Tdap vaccine

## 2023-02-14 NOTE — Assessment & Plan Note (Signed)
In groin and underneath abdominal folds and breast Ketoconazole cream Advised to keep area clean and dry Prescribed nystatin powder for PPx

## 2023-02-15 LAB — CMP14+EGFR
ALT: 22 IU/L (ref 0–32)
AST: 21 IU/L (ref 0–40)
Albumin: 4.1 g/dL (ref 3.9–4.9)
Alkaline Phosphatase: 54 IU/L (ref 44–121)
BUN/Creatinine Ratio: 15 (ref 12–28)
BUN: 14 mg/dL (ref 8–27)
Bilirubin Total: 0.2 mg/dL (ref 0.0–1.2)
CO2: 25 mmol/L (ref 20–29)
Calcium: 10.3 mg/dL (ref 8.7–10.3)
Chloride: 106 mmol/L (ref 96–106)
Creatinine, Ser: 0.95 mg/dL (ref 0.57–1.00)
Globulin, Total: 2.9 g/dL (ref 1.5–4.5)
Glucose: 116 mg/dL — ABNORMAL HIGH (ref 70–99)
Potassium: 5 mmol/L (ref 3.5–5.2)
Sodium: 145 mmol/L — ABNORMAL HIGH (ref 134–144)
Total Protein: 7 g/dL (ref 6.0–8.5)
eGFR: 64 mL/min/{1.73_m2} (ref >59)

## 2023-02-15 LAB — CBC WITH DIFFERENTIAL/PLATELET
Basophils Absolute: 0.1 10*3/uL (ref 0.0–0.2)
Basos: 1 %
EOS (ABSOLUTE): 0.2 10*3/uL (ref 0.0–0.4)
Eos: 4 %
Hematocrit: 36.6 % (ref 34.0–46.6)
Hemoglobin: 11.7 g/dL (ref 11.1–15.9)
Immature Grans (Abs): 0 10*3/uL (ref 0.0–0.1)
Immature Granulocytes: 0 %
Lymphocytes Absolute: 2.5 10*3/uL (ref 0.7–3.1)
Lymphs: 46 %
MCH: 26.7 pg (ref 26.6–33.0)
MCHC: 32 g/dL (ref 31.5–35.7)
MCV: 83 fL (ref 79–97)
Monocytes Absolute: 0.5 10*3/uL (ref 0.1–0.9)
Monocytes: 9 %
Neutrophils Absolute: 2.2 10*3/uL (ref 1.4–7.0)
Neutrophils: 40 %
Platelets: 335 10*3/uL (ref 150–450)
RBC: 4.39 x10E6/uL (ref 3.77–5.28)
RDW: 12.2 % (ref 11.7–15.4)
WBC: 5.5 10*3/uL (ref 3.4–10.8)

## 2023-02-15 LAB — HEMOGLOBIN A1C
Est. average glucose Bld gHb Est-mCnc: 157 mg/dL
Hgb A1c MFr Bld: 7.1 % — ABNORMAL HIGH (ref 4.8–5.6)

## 2023-02-15 LAB — LIPID PANEL
Chol/HDL Ratio: 3.2 ratio (ref 0.0–4.4)
Cholesterol, Total: 102 mg/dL (ref 100–199)
HDL: 32 mg/dL — ABNORMAL LOW (ref >39)
LDL Chol Calc (NIH): 49 mg/dL (ref 0–99)
Triglycerides: 115 mg/dL (ref 0–149)
VLDL Cholesterol Cal: 21 mg/dL (ref 5–40)

## 2023-06-19 ENCOUNTER — Encounter: Payer: Self-pay | Admitting: Internal Medicine

## 2023-06-19 ENCOUNTER — Ambulatory Visit (INDEPENDENT_AMBULATORY_CARE_PROVIDER_SITE_OTHER): Payer: 59 | Admitting: Internal Medicine

## 2023-06-19 VITALS — BP 124/70 | HR 66 | Ht 70.0 in | Wt 221.0 lb

## 2023-06-19 DIAGNOSIS — I10 Essential (primary) hypertension: Secondary | ICD-10-CM

## 2023-06-19 DIAGNOSIS — E1169 Type 2 diabetes mellitus with other specified complication: Secondary | ICD-10-CM | POA: Diagnosis not present

## 2023-06-19 DIAGNOSIS — E782 Mixed hyperlipidemia: Secondary | ICD-10-CM | POA: Diagnosis not present

## 2023-06-19 DIAGNOSIS — Z7984 Long term (current) use of oral hypoglycemic drugs: Secondary | ICD-10-CM

## 2023-06-19 DIAGNOSIS — Z0001 Encounter for general adult medical examination with abnormal findings: Secondary | ICD-10-CM

## 2023-06-19 DIAGNOSIS — Z23 Encounter for immunization: Secondary | ICD-10-CM

## 2023-06-19 DIAGNOSIS — L304 Erythema intertrigo: Secondary | ICD-10-CM

## 2023-06-19 DIAGNOSIS — E669 Obesity, unspecified: Secondary | ICD-10-CM

## 2023-06-19 MED ORDER — OZEMPIC (0.25 OR 0.5 MG/DOSE) 2 MG/3ML ~~LOC~~ SOPN
PEN_INJECTOR | SUBCUTANEOUS | 2 refills | Status: AC
Start: 2023-06-19 — End: 2023-09-11

## 2023-06-19 MED ORDER — METFORMIN HCL 500 MG PO TABS
500.0000 mg | ORAL_TABLET | Freq: Two times a day (BID) | ORAL | 1 refills | Status: DC
Start: 2023-06-19 — End: 2023-12-26

## 2023-06-19 NOTE — Assessment & Plan Note (Signed)
 Physical exam as documented. Counseling done  re healthy lifestyle involving commitment to 150 minutes exercise per week, heart healthy diet, and attaining healthy weight.The importance of adequate sleep also discussed. Immunization and cancer screening needs are specifically addressed at this visit.

## 2023-06-19 NOTE — Patient Instructions (Addendum)
Please start taking Ozempic 0.25 mg once weekly for 4 weeks and then 0.5 mg once weekly.  Please continue to take medications as prescribed.  Please continue to follow low carb diet and perform moderate exercise/walking at least 150 mins/week.  Please get fasting blood tests done before the next visit.  Please consider getting Shingrix vaccine at local pharmacy.

## 2023-06-19 NOTE — Progress Notes (Unsigned)
Established Patient Office Visit  Subjective:  Patient ID: [name], female    DOB: 1950/12/02  Age: 72 y.o. MRN: [id number]  CC:  Chief Complaint  Patient presents with   Annual Exam    HPI [name] is a 72 y.o. female with past medical history of HTN, allergic sinusitis, type II DM, osteopenia, HLD and obesity who presents for annual physical.  HTN: BP is wnl today. Takes medications regularly.  Patient denies dizziness, chest pain or palpitations.   Type II DM with HLD: She takes metformin for it, has started taking it BID now.  Her HbA1c was 7.4 today. She denies any polyuria or polydipsia.  She takes Zocor for HLD.  She reports recurrence of whitish patches underneath her breast area and groin area.  She uses ketoconazole cream.   Past Medical History:  Diagnosis Date   Allergy    Anemia    Hyperlipidemia    Hypertension    Sleep apnea    Type 2 diabetes mellitus (HCC)     Past Surgical History:  Procedure Laterality Date   ABDOMINAL HYSTERECTOMY     fibroids   COLONOSCOPY WITH PROPOFOL N/A 06/22/2021   Procedure: COLONOSCOPY WITH PROPOFOL;  Surgeon: Lanelle Bal, DO;  Location: [facility];  Service: Endoscopy;  Laterality: N/A;  1:00 / ASA II   fatty tumor     removed   POLYPECTOMY  06/22/2021   Procedure: POLYPECTOMY;  Surgeon: Lanelle Bal, DO;  Location: [facility];  Service: Endoscopy;;    Family History  Problem Relation Age of Onset   Alzheimer's disease Mother    Cancer Mother        unknown   Alcohol abuse Father    COPD Father    Diabetes Sister    Hypertension Sister    Huntington's disease Daughter     Social History   Socioeconomic History   Marital status: Divorced    Spouse name: Not on file   Number of children: 2   Years of education: Not on file   Highest education level: 11th grade  Occupational History   Occupation: unemployed    Comment: caregiver  Tobacco Use   Smoking status: Former     Current packs/day: 0.00    Types: Cigarettes    Start date: 08/02/1971    Quit date: 08/01/1986    Years since quitting: 36.9   Smokeless tobacco: Never  Vaping Use   Vaping status: Never Used  Substance and Sexual Activity   Alcohol use: No   Drug use: No   Sexual activity: Never    Birth control/protection: Surgical  Other Topics Concern   Not on file  Social History Narrative   Lives with disabled mother with Alzheimer's   Daughter with Hungtington's passed away August 30, 2019.   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   Social Determinants of Health   Financial Resource Strain: Low Risk  (11/28/2022)   Overall Financial Resource Strain (CARDIA)    Difficulty of Paying Living Expenses: Not hard at all  Food Insecurity: No Food Insecurity (11/28/2022)   Hunger Vital Sign    Worried About Running Out of Food in the Last Year: Never true    Ran Out of Food in the Last Year: Never true  Transportation Needs: No Transportation Needs (11/28/2022)   PRAPARE - Administrator, Civil Service (Medical): No    Lack of Transportation (Non-Medical): No  Physical Activity: Inactive (11/28/2022)   Exercise Vital Sign    Days of Exercise per Week: 0 days    Minutes of Exercise per Session: 0 min  Stress: No Stress Concern Present (11/28/2022)   [employer] of Occupational Health - Occupational Stress Questionnaire    Feeling of Stress : Not at all  Social Connections: Moderately Integrated (11/28/2022)   Social Connection and Isolation Panel [NHANES]    Frequency of Communication with Friends and Family: Twice a week    Frequency of Social Gatherings with Friends and Family: Once a week    Attends Religious Services: More than 4 times per year    Active Member of [employer] or Organizations: Yes    Attends Engineer, structural: More than 4 times per year    Marital Status: Divorced  Intimate Partner Violence: Not At Risk  (11/28/2022)   Humiliation, Afraid, Rape, and Kick questionnaire    Fear of Current or Ex-Partner: No    Emotionally Abused: No    Physically Abused: No    Sexually Abused: No    Outpatient Medications Prior to Visit  Medication Sig Dispense Refill   aspirin 81 MG tablet Take 81 mg by mouth daily.     Blood Glucose Monitoring Suppl (ONE TOUCH ULTRA 2) w/Device KIT Use it to check blood glucose as instructed. 1 kit 0   chlorpheniramine (CHLOR-TRIMETON) 4 MG tablet Take 4 mg by mouth daily.     Cholecalciferol (VITAMIN D3) 50 MCG (2000 UT) TABS Take 2,000 Units by mouth daily.     enalapril (VASOTEC) 10 MG tablet Take 1 tablet (10 mg total) by mouth daily. 100 tablet 5   fluticasone (FLONASE) 50 MCG/ACT nasal spray Place 2 sprays into both nostrils daily. (Patient not taking: Reported on 11/22/2021) 16 g 6   ketoconazole (NIZORAL) 2 % cream Apply 1 Application topically daily. 15 g 1   Lancet Device MISC 1 each by Does not apply route as directed. One touch Delica 1 each 0   Lancets MISC Use to test blood sugar daily 100 each 6   levocetirizine (XYZAL) 5 MG tablet Take 1 tablet (5 mg total) by mouth every evening. 30 tablet 3   Multiple Vitamin (MULTIVITAMINS PO) Take 1 tablet by mouth daily.     mupirocin ointment (BACTROBAN) 2 % Apply 1 Application topically 2 (two) times daily. 22 g 0   nystatin (MYCOSTATIN/NYSTOP) powder Apply 1 Application topically 3 (three) times daily. 60 g 0   Omega-3 Fatty Acids (FISH OIL) 1000 MG CAPS Take 2,000 mg by mouth 2 (two) times daily.     ONETOUCH ULTRA test strip USE 1 STRIP TO CHECK GLUCOSE ONCE DAILY 100 each 3   simvastatin (ZOCOR) 40 MG tablet Take 1 tablet (40 mg total) by mouth daily at 6 PM. 100 tablet 3   metFORMIN (GLUCOPHAGE) 500 MG tablet Take 1 tablet (500 mg total) by mouth 2 (two) times daily with a meal. 200 tablet 1   No facility-administered medications prior to visit.    Allergies  Allergen Reactions   Sulfonamide Derivatives Other  (See Comments)    Does not recall reaction     ROS Review of Systems  Constitutional:  Negative for chills and fever.  HENT:  Negative for congestion, sinus pressure and sinus pain.   Eyes:  Negative for pain and discharge.  Respiratory:  Negative for cough and wheezing.   Cardiovascular:  Negative for chest pain and palpitations.  Gastrointestinal:  Negative for  abdominal pain, diarrhea, nausea and vomiting.  Endocrine: Negative for polydipsia and polyuria.  Genitourinary:  Negative for dysuria and hematuria.  Musculoskeletal:  Negative for neck pain and neck stiffness.       Left hip and leg pain  Skin:  Positive for rash.       Mole over abdominal wall  Neurological:  Negative for dizziness and weakness.  Psychiatric/Behavioral:  Negative for agitation and behavioral problems.       Objective:    Physical Exam Vitals reviewed.  Constitutional:      General: She is not in acute distress.    Appearance: She is not diaphoretic.  HENT:     Head: Normocephalic and atraumatic.     Nose: No congestion.     Mouth/Throat:     Mouth: Mucous membranes are moist.     Comments: Vesicles over angle of mouth Eyes:     General: No scleral icterus.    Extraocular Movements: Extraocular movements intact.  Cardiovascular:     Rate and Rhythm: Normal rate and regular rhythm.     Heart sounds: Normal heart sounds. No murmur heard. Pulmonary:     Breath sounds: Normal breath sounds. No wheezing or rales.  Abdominal:     Palpations: Abdomen is soft.     Tenderness: There is no abdominal tenderness.  Musculoskeletal:     Cervical back: Neck supple. No tenderness.     Right lower leg: No edema.     Left lower leg: No edema.  Skin:    General: Skin is warm.     Findings: Rash (Whitish patches over erythematous base underneath breasts and in groin area) present.     Comments: 2 brownish moles with clear margin on left side of abdominal wall  Neurological:     General: No focal deficit  present.     Mental Status: She is alert and oriented to person, place, and time.  Psychiatric:        Mood and Affect: Mood normal.        Behavior: Behavior normal.     BP 124/70 (BP Location: Left Arm)   Pulse 66   Ht 5\' 10"  (1.778 m)   Wt 221 lb (100.2 kg)   SpO2 98%   BMI 31.71 kg/m  Wt Readings from Last 3 Encounters:  06/19/23 221 lb (100.2 kg)  02/14/23 214 lb 9.6 oz (97.3 kg)  11/28/22 219 lb 1.3 oz (99.4 kg)    Lab Results  Component Value Date   TSH 1.380 04/20/2022   Lab Results  Component Value Date   WBC 5.5 02/14/2023   HGB 11.7 02/14/2023   HCT 36.6 02/14/2023   MCV 83 02/14/2023   PLT 335 02/14/2023   Lab Results  Component Value Date   NA 145 (H) 02/14/2023   K 5.0 02/14/2023   CO2 25 02/14/2023   GLUCOSE 116 (H) 02/14/2023   BUN 14 02/14/2023   CREATININE 0.95 02/14/2023   BILITOT 0.2 02/14/2023   ALKPHOS 54 02/14/2023   AST 21 02/14/2023   ALT 22 02/14/2023   PROT 7.0 02/14/2023   ALBUMIN 4.1 02/14/2023   CALCIUM 10.3 02/14/2023   ANIONGAP 7 01/16/2017   EGFR 64 02/14/2023   Lab Results  Component Value Date   CHOL 102 02/14/2023   Lab Results  Component Value Date   HDL 32 (L) 02/14/2023   Lab Results  Component Value Date   LDLCALC 49 02/14/2023   Lab Results  Component Value  Date   TRIG 115 02/14/2023   Lab Results  Component Value Date   CHOLHDL 3.2 02/14/2023   Lab Results  Component Value Date   HGBA1C 7.4 (H) 06/19/2023      Assessment & Plan:   Problem List Items Addressed This Visit       Cardiovascular and Mediastinum   Essential hypertension, benign    BP Readings from Last 1 Encounters:  06/19/23 124/70   Well-controlled with enalapril 10 mg QD Counseled for compliance with the medications Advised DASH diet and moderate exercise/walking, at least 150 mins/week        Endocrine   Diabetes mellitus (HCC)    Lab Results  Component Value Date   HGBA1C 7.4 (H) 06/19/2023   Associated with  HTN, HLD and obesity Uncontrolled - home blood glucose has been 110-150 mostly On Metformin 500 mg BID Added Ozempic-plan to increase dose as tolerated Advised to follow diabetic diet On statin and ACEi F/u CMP and lipid panel Diabetic eye exam: Advised to follow up with Ophthalmology for diabetic eye exam      Relevant Medications   Semaglutide,0.25 or 0.5MG /DOS, (OZEMPIC, 0.25 OR 0.5 MG/DOSE,) 2 MG/3ML SOPN   metFORMIN (GLUCOPHAGE) 500 MG tablet   Other Relevant Orders   Bayer DCA Hb A1c Waived (Completed)   CMP14+EGFR   Hemoglobin A1c   Urine Microalbumin w/creat. ratio (Completed)     Musculoskeletal and Integument   Intertrigo    In groin and underneath abdominal folds and breast Has Ketoconazole cream Advised to keep area clean and dry Prescribed nystatin powder for PPx        Other   Hyperlipidemia    On simvastatin 40 mg  QD Check lipid profile      Relevant Orders   Lipid Profile   Obesity (BMI 30-39.9)    Diet modification and moderate exercise advised Started GLP1 agonist for type 2 DM      Relevant Medications   Semaglutide,0.25 or 0.5MG /DOS, (OZEMPIC, 0.25 OR 0.5 MG/DOSE,) 2 MG/3ML SOPN   metFORMIN (GLUCOPHAGE) 500 MG tablet   Encounter for general adult medical examination with abnormal findings - Primary    Physical exam as documented. Counseling done  re healthy lifestyle involving commitment to 150 minutes exercise per week, heart healthy diet, and attaining healthy weight.The importance of adequate sleep also discussed. Immunization and cancer screening needs are specifically addressed at this visit.      Other Visit Diagnoses     Encounter for immunization       Relevant Orders   Flu Vaccine Trivalent High Dose (Fluad) (Completed)       Meds ordered this encounter  Medications   Semaglutide,0.25 or 0.5MG /DOS, (OZEMPIC, 0.25 OR 0.5 MG/DOSE,) 2 MG/3ML SOPN    Sig: Inject 0.25 mg into the skin every 7 (seven) days for 28 days, THEN 0.5 mg  every 7 (seven) days.    Dispense:  3 mL    Refill:  2   metFORMIN (GLUCOPHAGE) 500 MG tablet    Sig: Take 1 tablet (500 mg total) by mouth 2 (two) times daily with a meal.    Dispense:  200 tablet    Refill:  1    Follow-up: Return in about 3 months (around 09/19/2023) for DM.    Anabel Halon, MD

## 2023-06-19 NOTE — Assessment & Plan Note (Signed)
Lab Results  Component Value Date   HGBA1C 7.4 (H) 06/19/2023   Associated with HTN, HLD and obesity Uncontrolled - home blood glucose has been 110-150 mostly On Metformin 500 mg BID Added Ozempic-plan to increase dose as tolerated Advised to follow diabetic diet On statin and ACEi F/u CMP and lipid panel Diabetic eye exam: Advised to follow up with Ophthalmology for diabetic eye exam

## 2023-06-19 NOTE — Assessment & Plan Note (Signed)
BP Readings from Last 1 Encounters:  06/19/23 124/70   Well-controlled with enalapril 10 mg QD Counseled for compliance with the medications Advised DASH diet and moderate exercise/walking, at least 150 mins/week

## 2023-06-19 NOTE — Assessment & Plan Note (Signed)
On simvastatin 40 mg  QD Lipid profile reviewed

## 2023-06-21 LAB — MICROALBUMIN / CREATININE URINE RATIO
Creatinine, Urine: 147.1 mg/dL
Microalb/Creat Ratio: 26 mg/g{creat} (ref 0–29)
Microalbumin, Urine: 38 ug/mL

## 2023-06-22 LAB — BAYER DCA HB A1C WAIVED: HB A1C (BAYER DCA - WAIVED): 7.4 % — ABNORMAL HIGH (ref 4.8–5.6)

## 2023-06-22 NOTE — Assessment & Plan Note (Signed)
Diet modification and moderate exercise advised Started GLP1 agonist for type 2 DM

## 2023-06-22 NOTE — Assessment & Plan Note (Signed)
In groin and underneath abdominal folds and breast Has Ketoconazole cream Advised to keep area clean and dry Prescribed nystatin powder for PPx

## 2023-09-05 ENCOUNTER — Other Ambulatory Visit: Payer: Self-pay | Admitting: Internal Medicine

## 2023-09-23 ENCOUNTER — Other Ambulatory Visit: Payer: Self-pay | Admitting: Internal Medicine

## 2023-09-23 DIAGNOSIS — E119 Type 2 diabetes mellitus without complications: Secondary | ICD-10-CM

## 2023-09-25 ENCOUNTER — Encounter: Payer: Self-pay | Admitting: Internal Medicine

## 2023-09-25 ENCOUNTER — Ambulatory Visit (INDEPENDENT_AMBULATORY_CARE_PROVIDER_SITE_OTHER): Payer: 59 | Admitting: Internal Medicine

## 2023-09-25 VITALS — BP 119/69 | HR 84 | Ht 66.0 in | Wt 209.2 lb

## 2023-09-25 DIAGNOSIS — Z7984 Long term (current) use of oral hypoglycemic drugs: Secondary | ICD-10-CM

## 2023-09-25 DIAGNOSIS — Z7985 Long-term (current) use of injectable non-insulin antidiabetic drugs: Secondary | ICD-10-CM

## 2023-09-25 DIAGNOSIS — E1169 Type 2 diabetes mellitus with other specified complication: Secondary | ICD-10-CM | POA: Diagnosis not present

## 2023-09-25 DIAGNOSIS — E782 Mixed hyperlipidemia: Secondary | ICD-10-CM | POA: Diagnosis not present

## 2023-09-25 DIAGNOSIS — I10 Essential (primary) hypertension: Secondary | ICD-10-CM

## 2023-09-25 MED ORDER — SEMAGLUTIDE (1 MG/DOSE) 4 MG/3ML ~~LOC~~ SOPN: 1.0000 mg | PEN_INJECTOR | SUBCUTANEOUS | 3 refills | Status: DC

## 2023-09-25 MED ORDER — BLOOD GLUCOSE MONITORING SUPPL DEVI: 1.0000 | Freq: Three times a day (TID) | 0 refills | Status: DC

## 2023-09-25 NOTE — Assessment & Plan Note (Signed)
 BP Readings from Last 1 Encounters:  09/25/23 119/69   Well-controlled with enalapril 10 mg QD Counseled for compliance with the medications Advised DASH diet and moderate exercise/walking, at least 150 mins/week

## 2023-09-25 NOTE — Assessment & Plan Note (Addendum)
 Lab Results  Component Value Date   HGBA1C 7.4 (H) 06/19/2023   Associated with HTN, HLD and obesity Was uncontrolled, but improved now- home blood glucose has been 90-120 mostly Had added Ozempic 0.5 mg qw, increased dose to 1 mg qw now On Metformin 500 mg BID, advised to decrease dose of Metformin to 500 mg QD once she starts Ozempic 1 mg qw Advised to follow diabetic diet On statin and ACEi F/u CMP and lipid panel Diabetic eye exam: Advised to follow up with Ophthalmology for diabetic eye exam

## 2023-09-25 NOTE — Patient Instructions (Signed)
 Please start taking Ozempic 1 mg once weekly. Please take Metformin only once daily after starting Ozempic 1 mg dose.  Please continue to take medications as prescribed.  Please continue to follow low carb diet and perform moderate exercise/walking at least 150 mins/week.

## 2023-09-25 NOTE — Assessment & Plan Note (Signed)
 On simvastatin 40 mg  QD Check lipid profile

## 2023-09-25 NOTE — Progress Notes (Signed)
 Established Patient Office Visit  Subjective:  Patient ID: [name], female    DOB: 1951-06-06  Age: 73 y.o. MRN: [id number]  CC:  Chief Complaint  Patient presents with   Care Management    3 month f/u, states she may need a new glucometer.     HPI [name] is a 73 y.o. female with past medical history of HTN, allergic sinusitis, type II DM, osteopenia, HLD and obesity who presents for f/u of her chronic medical conditions.  HTN: BP is wnl today. Takes medications regularly.  Patient denies dizziness, chest pain or palpitations.   Type II DM with HLD: She  has been tolerating Ozempic 0.5 mg QW dose. She also takes metformin 500 mg BID.  Her HbA1c was 7.4 in 11/24.  Her blood glucose levels have improved to 90-120 mostly since starting Ozempic.  She denies any polyuria or polydipsia.  She takes Zocor for HLD.   Past Medical History:  Diagnosis Date   Allergy    Anemia    Hyperlipidemia    Hypertension    Sleep apnea    Type 2 diabetes mellitus (HCC)     Past Surgical History:  Procedure Laterality Date   ABDOMINAL HYSTERECTOMY     fibroids   COLONOSCOPY WITH PROPOFOL N/A 06/22/2021   Procedure: COLONOSCOPY WITH PROPOFOL;  Surgeon: Lanelle Bal, DO;  Location: [facility];  Service: Endoscopy;  Laterality: N/A;  1:00 / ASA II   fatty tumor     removed   POLYPECTOMY  06/22/2021   Procedure: POLYPECTOMY;  Surgeon: Lanelle Bal, DO;  Location: [facility];  Service: Endoscopy;;    Family History  Problem Relation Age of Onset   Alzheimer's disease Mother    Cancer Mother        unknown   Alcohol abuse Father    COPD Father    Diabetes Sister    Hypertension Sister    Huntington's disease Daughter     Social History   Socioeconomic History   Marital status: Divorced    Spouse name: Not on file   Number of children: 2   Years of education: Not on file   Highest education level: 11th grade  Occupational History    Occupation: unemployed    Comment: caregiver  Tobacco Use   Smoking status: Former    Current packs/day: 0.00    Types: Cigarettes    Start date: 08/02/1971    Quit date: 08/01/1986    Years since quitting: 37.1   Smokeless tobacco: Never  Vaping Use   Vaping status: Never Used  Substance and Sexual Activity   Alcohol use: No   Drug use: No   Sexual activity: Never    Birth control/protection: Surgical  Other Topics Concern   Not on file  Social History Narrative   Lives with disabled mother with Alzheimer's   Daughter with Hungtington's passed away August 08, 2019.   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   Social Drivers of Corporate investment banker Strain: Low Risk  (11/28/2022)   Overall Financial Resource Strain (CARDIA)    Difficulty of Paying Living Expenses: Not hard at all  Food Insecurity: No Food Insecurity (11/28/2022)   Hunger Vital Sign    Worried About Running Out of Food in the Last Year: Never true    Ran Out of Food in the Last Year: Never true  Transportation Needs: No Transportation Needs (11/28/2022)  PRAPARE - Administrator, Civil Service (Medical): No    Lack of Transportation (Non-Medical): No  Physical Activity: Inactive (11/28/2022)   Exercise Vital Sign    Days of Exercise per Week: 0 days    Minutes of Exercise per Session: 0 min  Stress: No Stress Concern Present (11/28/2022)   [employer] of Occupational Health - Occupational Stress Questionnaire    Feeling of Stress : Not at all  Social Connections: Moderately Integrated (11/28/2022)   Social Connection and Isolation Panel [NHANES]    Frequency of Communication with Friends and Family: Twice a week    Frequency of Social Gatherings with Friends and Family: Once a week    Attends Religious Services: More than 4 times per year    Active Member of [employer] or Organizations: Yes    Attends Engineer, structural: More than 4  times per year    Marital Status: Divorced  Intimate Partner Violence: Not At Risk (11/28/2022)   Humiliation, Afraid, Rape, and Kick questionnaire    Fear of Current or Ex-Partner: No    Emotionally Abused: No    Physically Abused: No    Sexually Abused: No    Outpatient Medications Prior to Visit  Medication Sig Dispense Refill   aspirin 81 MG tablet Take 81 mg by mouth daily.     Blood Glucose Monitoring Suppl (ONE TOUCH ULTRA 2) w/Device KIT Use it to check blood glucose as instructed. 1 kit 0   chlorpheniramine (CHLOR-TRIMETON) 4 MG tablet Take 4 mg by mouth daily.     Cholecalciferol (VITAMIN D3) 50 MCG (2000 UT) TABS Take 2,000 Units by mouth daily.     enalapril (VASOTEC) 10 MG tablet Take 1 tablet (10 mg total) by mouth daily. 100 tablet 5   fluticasone (FLONASE) 50 MCG/ACT nasal spray Place 2 sprays into both nostrils daily. 16 g 6   ketoconazole (NIZORAL) 2 % cream Apply 1 Application topically daily. 15 g 1   Lancets (ONETOUCH DELICA PLUS LANCET33G) MISC USE TO CHECK GLUCOSE ONCE DAILY 100 each 0   levocetirizine (XYZAL) 5 MG tablet Take 1 tablet (5 mg total) by mouth every evening. 30 tablet 3   metFORMIN (GLUCOPHAGE) 500 MG tablet Take 1 tablet (500 mg total) by mouth 2 (two) times daily with a meal. 200 tablet 1   Multiple Vitamin (MULTIVITAMINS PO) Take 1 tablet by mouth daily.     mupirocin ointment (BACTROBAN) 2 % Apply 1 Application topically 2 (two) times daily. 22 g 0   nystatin (MYCOSTATIN/NYSTOP) powder Apply 1 Application topically 3 (three) times daily. 60 g 0   Omega-3 Fatty Acids (FISH OIL) 1000 MG CAPS Take 2,000 mg by mouth 2 (two) times daily.     ONETOUCH ULTRA test strip USE 1 STRIP TO CHECK GLUCOSE ONCE DAILY 100 each 0   simvastatin (ZOCOR) 40 MG tablet Take 1 tablet (40 mg total) by mouth daily at 6 PM. 100 tablet 3   Lancet Device MISC 1 each by Does not apply route as directed. One touch Delica 1 each 0   No facility-administered medications prior to  visit.    Allergies  Allergen Reactions   Sulfonamide Derivatives Other (See Comments)    Does not recall reaction     ROS Review of Systems  Constitutional:  Negative for chills and fever.  HENT:  Negative for congestion, sinus pressure and sinus pain.   Eyes:  Negative for pain and discharge.  Respiratory:  Negative for  cough and wheezing.   Cardiovascular:  Negative for chest pain and palpitations.  Gastrointestinal:  Negative for abdominal pain, diarrhea, nausea and vomiting.  Endocrine: Negative for polydipsia and polyuria.  Genitourinary:  Negative for dysuria and hematuria.  Musculoskeletal:  Negative for neck pain and neck stiffness.       Left hip and leg pain  Skin:  Negative for rash.       Mole over abdominal wall  Neurological:  Negative for dizziness and weakness.  Psychiatric/Behavioral:  Negative for agitation and behavioral problems.       Objective:    Physical Exam Vitals reviewed.  Constitutional:      General: She is not in acute distress.    Appearance: She is not diaphoretic.  HENT:     Head: Normocephalic and atraumatic.     Nose: No congestion.     Mouth/Throat:     Mouth: Mucous membranes are moist.     Comments: Vesicles over angle of mouth Eyes:     General: No scleral icterus.    Extraocular Movements: Extraocular movements intact.  Cardiovascular:     Rate and Rhythm: Normal rate and regular rhythm.     Heart sounds: Normal heart sounds. No murmur heard. Pulmonary:     Breath sounds: Normal breath sounds. No wheezing or rales.  Musculoskeletal:     Cervical back: Neck supple. No tenderness.     Right lower leg: No edema.     Left lower leg: No edema.  Skin:    General: Skin is warm.     Findings: No rash.     Comments: 2 brownish moles with clear margin on left side of abdominal wall  Neurological:     General: No focal deficit present.     Mental Status: She is alert and oriented to person, place, and time.  Psychiatric:         Mood and Affect: Mood normal.        Behavior: Behavior normal.     BP 119/69   Pulse 84   Ht 5\' 6"  (1.676 m)   Wt 209 lb 3.2 oz (94.9 kg)   SpO2 94%   BMI 33.77 kg/m  Wt Readings from Last 3 Encounters:  09/25/23 209 lb 3.2 oz (94.9 kg)  06/19/23 221 lb (100.2 kg)  02/14/23 214 lb 9.6 oz (97.3 kg)    Lab Results  Component Value Date   TSH 1.380 04/20/2022   Lab Results  Component Value Date   WBC 5.5 02/14/2023   HGB 11.7 02/14/2023   HCT 36.6 02/14/2023   MCV 83 02/14/2023   PLT 335 02/14/2023   Lab Results  Component Value Date   NA 145 (H) 02/14/2023   K 5.0 02/14/2023   CO2 25 02/14/2023   GLUCOSE 116 (H) 02/14/2023   BUN 14 02/14/2023   CREATININE 0.95 02/14/2023   BILITOT 0.2 02/14/2023   ALKPHOS 54 02/14/2023   AST 21 02/14/2023   ALT 22 02/14/2023   PROT 7.0 02/14/2023   ALBUMIN 4.1 02/14/2023   CALCIUM 10.3 02/14/2023   ANIONGAP 7 01/16/2017   EGFR 64 02/14/2023   Lab Results  Component Value Date   CHOL 102 02/14/2023   Lab Results  Component Value Date   HDL 32 (L) 02/14/2023   Lab Results  Component Value Date   LDLCALC 49 02/14/2023   Lab Results  Component Value Date   TRIG 115 02/14/2023   Lab Results  Component Value Date  CHOLHDL 3.2 02/14/2023   Lab Results  Component Value Date   HGBA1C 7.4 (H) 06/19/2023      Assessment & Plan:   Problem List Items Addressed This Visit       Cardiovascular and Mediastinum   Essential hypertension, benign   BP Readings from Last 1 Encounters:  09/25/23 119/69   Well-controlled with enalapril 10 mg QD Counseled for compliance with the medications Advised DASH diet and moderate exercise/walking, at least 150 mins/week        Endocrine   Type 2 diabetes mellitus with other specified complication (HCC) - Primary   Lab Results  Component Value Date   HGBA1C 7.4 (H) 06/19/2023   Associated with HTN, HLD and obesity Was uncontrolled, but improved now- home blood glucose  has been 90-120 mostly Had added Ozempic 0.5 mg qw, increased dose to 1 mg qw now On Metformin 500 mg BID, advised to decrease dose of Metformin to 500 mg QD once she starts Ozempic 1 mg qw Advised to follow diabetic diet On statin and ACEi F/u CMP and lipid panel Diabetic eye exam: Advised to follow up with Ophthalmology for diabetic eye exam      Relevant Medications   Semaglutide, 1 MG/DOSE, 4 MG/3ML SOPN   Blood Glucose Monitoring Suppl DEVI     Other   Hyperlipidemia   On simvastatin 40 mg  QD Check lipid profile        Meds ordered this encounter  Medications   Semaglutide, 1 MG/DOSE, 4 MG/3ML SOPN    Sig: Inject 1 mg as directed once a week.    Dispense:  3 mL    Refill:  3   Blood Glucose Monitoring Suppl DEVI    Sig: 1 each by Does not apply route in the morning, at noon, and at bedtime. Onetouch Ultra preferable. Otherwise, may substitute to any manufacturer covered by patient's insurance.    Dispense:  1 each    Refill:  0    Follow-up: No follow-ups on file.    Anabel Halon, MD

## 2023-09-26 LAB — LIPID PANEL
Chol/HDL Ratio: 3.1 ratio (ref 0.0–4.4)
Cholesterol, Total: 109 mg/dL (ref 100–199)
HDL: 35 mg/dL — ABNORMAL LOW (ref >39)
LDL Chol Calc (NIH): 54 mg/dL (ref 0–99)
Triglycerides: 108 mg/dL (ref 0–149)
VLDL Cholesterol Cal: 20 mg/dL (ref 5–40)

## 2023-09-26 LAB — CMP14+EGFR
ALT: 14 IU/L (ref 0–32)
AST: 15 IU/L (ref 0–40)
Albumin: 4.1 g/dL (ref 3.8–4.8)
Alkaline Phosphatase: 57 IU/L (ref 44–121)
BUN/Creatinine Ratio: 9 — ABNORMAL LOW (ref 12–28)
BUN: 10 mg/dL (ref 8–27)
Bilirubin Total: 0.4 mg/dL (ref 0.0–1.2)
CO2: 27 mmol/L (ref 20–29)
Calcium: 10.4 mg/dL — ABNORMAL HIGH (ref 8.7–10.3)
Chloride: 104 mmol/L (ref 96–106)
Creatinine, Ser: 1.09 mg/dL — ABNORMAL HIGH (ref 0.57–1.00)
Globulin, Total: 2.8 g/dL (ref 1.5–4.5)
Glucose: 102 mg/dL — ABNORMAL HIGH (ref 70–99)
Potassium: 4.3 mmol/L (ref 3.5–5.2)
Sodium: 144 mmol/L (ref 134–144)
Total Protein: 6.9 g/dL (ref 6.0–8.5)
eGFR: 54 mL/min/{1.73_m2} — ABNORMAL LOW (ref >59)

## 2023-09-26 LAB — HEMOGLOBIN A1C
Est. average glucose Bld gHb Est-mCnc: 146 mg/dL
Hgb A1c MFr Bld: 6.7 % — ABNORMAL HIGH (ref 4.8–5.6)

## 2023-11-09 ENCOUNTER — Telehealth: Payer: Self-pay

## 2023-11-09 NOTE — Telephone Encounter (Signed)
 Patient called and advised it's ok to take her missed dose of ozempic today. She asked does she go back to taking it on Wednesday's next week, her normal day? Advised she can take it next Wednesday or change to Thursday's. She verbalized understanding.  Copied from CRM [id number]. Topic: Clinical - Medication Question >> Nov 09, 2023  9:06 AM Turkey B wrote: Reason for CRM: pt called in , states she forgot to take Ozempic yesterday, and wants to know if she can take it now or wait until next Wednesday

## 2023-12-16 ENCOUNTER — Other Ambulatory Visit: Payer: Self-pay | Admitting: Internal Medicine

## 2023-12-21 ENCOUNTER — Other Ambulatory Visit: Payer: Self-pay | Admitting: Internal Medicine

## 2023-12-21 DIAGNOSIS — E782 Mixed hyperlipidemia: Secondary | ICD-10-CM

## 2023-12-26 ENCOUNTER — Encounter: Payer: Self-pay | Admitting: Internal Medicine

## 2023-12-26 ENCOUNTER — Ambulatory Visit (INDEPENDENT_AMBULATORY_CARE_PROVIDER_SITE_OTHER): Payer: 59 | Admitting: Internal Medicine

## 2023-12-26 VITALS — BP 128/72 | HR 88 | Ht 66.0 in | Wt 207.4 lb

## 2023-12-26 DIAGNOSIS — I10 Essential (primary) hypertension: Secondary | ICD-10-CM | POA: Diagnosis not present

## 2023-12-26 DIAGNOSIS — Z7984 Long term (current) use of oral hypoglycemic drugs: Secondary | ICD-10-CM

## 2023-12-26 DIAGNOSIS — E1169 Type 2 diabetes mellitus with other specified complication: Secondary | ICD-10-CM | POA: Diagnosis not present

## 2023-12-26 DIAGNOSIS — Z7985 Long-term (current) use of injectable non-insulin antidiabetic drugs: Secondary | ICD-10-CM | POA: Diagnosis not present

## 2023-12-26 DIAGNOSIS — R7989 Other specified abnormal findings of blood chemistry: Secondary | ICD-10-CM | POA: Diagnosis not present

## 2023-12-26 DIAGNOSIS — E782 Mixed hyperlipidemia: Secondary | ICD-10-CM | POA: Diagnosis not present

## 2023-12-26 DIAGNOSIS — E559 Vitamin D deficiency, unspecified: Secondary | ICD-10-CM

## 2023-12-26 MED ORDER — ENALAPRIL MALEATE 10 MG PO TABS: 10.0000 mg | ORAL_TABLET | Freq: Every day | ORAL | 5 refills | Status: AC

## 2023-12-26 MED ORDER — SEMAGLUTIDE (1 MG/DOSE) 4 MG/3ML ~~LOC~~ SOPN: 1.0000 mg | PEN_INJECTOR | SUBCUTANEOUS | 5 refills | Status: DC

## 2023-12-26 MED ORDER — METFORMIN HCL 500 MG PO TABS
500.0000 mg | ORAL_TABLET | Freq: Every day | ORAL | 1 refills | Status: DC
Start: 2023-12-26 — End: 2024-01-29

## 2023-12-26 NOTE — Assessment & Plan Note (Signed)
 Lab Results  Component Value Date   HGBA1C 6.7 (H) 09/25/2023   Associated with HTN, HLD and obesity Well-controlled now- home blood glucose has been 90-120 mostly On Ozempic  1 mg qw and Metformin  500 mg QD Advised to follow diabetic diet On statin and ACEi F/u HbA1c, CMP and lipid panel Diabetic eye exam: Advised to follow up with Ophthalmology for diabetic eye exam

## 2023-12-26 NOTE — Progress Notes (Unsigned)
 Established Patient Office Visit  Subjective:  Patient ID: [name], female    DOB: 1950-11-15  Age: 73 y.o. MRN: [id number]  CC:  Chief Complaint  Patient presents with   Medical Management of Chronic Issues    3 month f/u    HPI [name] is a 73 y.o. female with past medical history of HTN, allergic sinusitis, type II DM, osteopenia, HLD and obesity who presents for f/u of her chronic medical conditions.  HTN: BP is wnl today. Takes medications regularly.  Patient denies dizziness, chest pain or palpitations.   Type II DM with HLD: She  has been tolerating Ozempic  1 mg QW dose. She also takes metformin  500 mg BID.  Her HbA1c was 6.7 in 02/25.  Her blood glucose levels have improved to 90-120 mostly since starting Ozempic .  She denies any polyuria or polydipsia.  She takes Zocor  for HLD.   Past Medical History:  Diagnosis Date   Allergy    Anemia    Hyperlipidemia    Hypertension    Sleep apnea    Type 2 diabetes mellitus (HCC)     Past Surgical History:  Procedure Laterality Date   ABDOMINAL HYSTERECTOMY     fibroids   COLONOSCOPY WITH PROPOFOL  N/A 06/22/2021   Procedure: COLONOSCOPY WITH PROPOFOL ;  Surgeon: Vinetta Greening, DO;  Location: [facility];  Service: Endoscopy;  Laterality: N/A;  1:00 / ASA II   fatty tumor     removed   POLYPECTOMY  06/22/2021   Procedure: POLYPECTOMY;  Surgeon: Vinetta Greening, DO;  Location: [facility];  Service: Endoscopy;;    Family History  Problem Relation Age of Onset   Alzheimer's disease Mother    Cancer Mother        unknown   Alcohol abuse Father    COPD Father    Diabetes Sister    Hypertension Sister    Huntington's disease Daughter     Social History   Socioeconomic History   Marital status: Divorced    Spouse name: Not on file   Number of children: 2   Years of education: Not on file   Highest education level: 11th grade  Occupational History   Occupation: unemployed     Comment: caregiver  Tobacco Use   Smoking status: Former    Current packs/day: 0.00    Types: Cigarettes    Start date: 03-Aug-1971    Quit date: 08-02-86    Years since quitting: 37.4   Smokeless tobacco: Never  Vaping Use   Vaping status: Never Used  Substance and Sexual Activity   Alcohol use: No   Drug use: No   Sexual activity: Never    Birth control/protection: Surgical  Other Topics Concern   Not on file  Social History Narrative   Lives with disabled mother with Alzheimer's   Daughter with Hungtington's passed away Aug 03, 2019.   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   Social Drivers of Corporate investment banker Strain: Low Risk  (11/28/2022)   Overall Financial Resource Strain (CARDIA)    Difficulty of Paying Living Expenses: Not hard at all  Food Insecurity: No Food Insecurity (11/28/2022)   Hunger Vital Sign    Worried About Running Out of Food in the Last Year: Never true    Ran Out of Food in the Last Year: Never true  Transportation Needs: No Transportation Needs (11/28/2022)   PRAPARE - Transportation  Lack of Transportation (Medical): No    Lack of Transportation (Non-Medical): No  Physical Activity: Inactive (11/28/2022)   Exercise Vital Sign    Days of Exercise per Week: 0 days    Minutes of Exercise per Session: 0 min  Stress: No Stress Concern Present (11/28/2022)   [employer] of Occupational Health - Occupational Stress Questionnaire    Feeling of Stress : Not at all  Social Connections: Moderately Integrated (11/28/2022)   Social Connection and Isolation Panel [NHANES]    Frequency of Communication with Friends and Family: Twice a week    Frequency of Social Gatherings with Friends and Family: Once a week    Attends Religious Services: More than 4 times per year    Active Member of [employer] or Organizations: Yes    Attends Engineer, structural: More than 4 times per year    Marital  Status: Divorced  Intimate Partner Violence: Not At Risk (11/28/2022)   Humiliation, Afraid, Rape, and Kick questionnaire    Fear of Current or Ex-Partner: No    Emotionally Abused: No    Physically Abused: No    Sexually Abused: No    Outpatient Medications Prior to Visit  Medication Sig Dispense Refill   amoxicillin (AMOXIL) 500 MG capsule Take 500 mg by mouth 3 (three) times daily.     aspirin 81 MG tablet Take 81 mg by mouth daily.     Blood Glucose Monitoring Suppl (ONE TOUCH ULTRA 2) w/Device KIT Use it to check blood glucose as instructed. 1 kit 0   Blood Glucose Monitoring Suppl DEVI 1 each by Does not apply route in the morning, at noon, and at bedtime. Onetouch Ultra preferable. Otherwise, may substitute to any manufacturer covered by patient's insurance. 1 each 0   chlorpheniramine (CHLOR-TRIMETON) 4 MG tablet Take 4 mg by mouth daily.     Cholecalciferol (VITAMIN D3) 50 MCG (2000 UT) TABS Take 2,000 Units by mouth daily.     fluticasone  (FLONASE ) 50 MCG/ACT nasal spray Place 2 sprays into both nostrils daily. 16 g 6   ketoconazole  (NIZORAL ) 2 % cream Apply 1 Application topically daily. 15 g 1   Lancets (ONETOUCH DELICA PLUS LANCET33G) MISC USE TO CHECK GLUCOSE ONCE DAILY 100 each 0   levocetirizine (XYZAL ) 5 MG tablet Take 1 tablet (5 mg total) by mouth every evening. 30 tablet 3   Multiple Vitamin (MULTIVITAMINS PO) Take 1 tablet by mouth daily.     mupirocin  ointment (BACTROBAN ) 2 % Apply 1 Application topically 2 (two) times daily. 22 g 0   nystatin  (MYCOSTATIN /NYSTOP ) powder Apply 1 Application topically 3 (three) times daily. 60 g 0   Omega-3 Fatty Acids (FISH OIL) 1000 MG CAPS Take 2,000 mg by mouth 2 (two) times daily.     ONETOUCH ULTRA test strip USE 1 STRIP TO CHECK GLUCOSE ONCE DAILY 100 each 0   simvastatin  (ZOCOR ) 40 MG tablet TAKE 1 TABLET BY MOUTH ONCE DAILY AT  6  PM 90 tablet 0   enalapril  (VASOTEC ) 10 MG tablet Take 1 tablet (10 mg total) by mouth daily. 100  tablet 5   metFORMIN  (GLUCOPHAGE ) 500 MG tablet Take 1 tablet (500 mg total) by mouth 2 (two) times daily with a meal. 200 tablet 1   Semaglutide , 1 MG/DOSE, 4 MG/3ML SOPN Inject 1 mg as directed once a week. 3 mL 3   No facility-administered medications prior to visit.    Allergies  Allergen Reactions   Sulfonamide Derivatives Other (See  Comments)    Does not recall reaction     ROS Review of Systems  Constitutional:  Negative for chills and fever.  HENT:  Negative for congestion, sinus pressure and sinus pain.   Eyes:  Negative for pain and discharge.  Respiratory:  Negative for cough and wheezing.   Cardiovascular:  Negative for chest pain and palpitations.  Gastrointestinal:  Negative for abdominal pain, diarrhea, nausea and vomiting.  Endocrine: Negative for polydipsia and polyuria.  Genitourinary:  Negative for dysuria and hematuria.  Musculoskeletal:  Negative for neck pain and neck stiffness.       Left hip and leg pain  Skin:  Negative for rash.       Mole over abdominal wall  Neurological:  Negative for dizziness and weakness.  Psychiatric/Behavioral:  Negative for agitation and behavioral problems.       Objective:    Physical Exam Vitals reviewed.  Constitutional:      General: She is not in acute distress.    Appearance: She is not diaphoretic.  HENT:     Head: Normocephalic and atraumatic.     Nose: No congestion.     Mouth/Throat:     Mouth: Mucous membranes are moist.     Comments: Vesicles over angle of mouth Eyes:     General: No scleral icterus.    Extraocular Movements: Extraocular movements intact.  Cardiovascular:     Rate and Rhythm: Normal rate and regular rhythm.     Heart sounds: Normal heart sounds. No murmur heard. Pulmonary:     Breath sounds: Normal breath sounds. No wheezing or rales.  Musculoskeletal:     Cervical back: Neck supple. No tenderness.     Right lower leg: No edema.     Left lower leg: No edema.  Skin:    General:  Skin is warm.     Findings: No rash.     Comments: 2 brownish moles with clear margin on left side of abdominal wall  Neurological:     General: No focal deficit present.     Mental Status: She is alert and oriented to person, place, and time.  Psychiatric:        Mood and Affect: Mood normal.        Behavior: Behavior normal.     BP 128/72   Pulse 88   Ht 5\' 6"  (1.676 m)   Wt 207 lb 6.4 oz (94.1 kg)   SpO2 96%   BMI 33.48 kg/m  Wt Readings from Last 3 Encounters:  12/26/23 207 lb 6.4 oz (94.1 kg)  09/25/23 209 lb 3.2 oz (94.9 kg)  06/19/23 221 lb (100.2 kg)    Lab Results  Component Value Date   TSH 1.380 04/20/2022   Lab Results  Component Value Date   WBC 5.5 12/26/2023   HGB 12.1 12/26/2023   HCT 39.2 12/26/2023   MCV 87 12/26/2023   PLT 409 12/26/2023   Lab Results  Component Value Date   NA 143 12/26/2023   K 4.7 12/26/2023   CO2 25 12/26/2023   GLUCOSE 94 12/26/2023   BUN 10 12/26/2023   CREATININE 0.99 12/26/2023   BILITOT 0.2 12/26/2023   ALKPHOS 51 12/26/2023   AST 28 12/26/2023   ALT 30 12/26/2023   PROT 6.7 12/26/2023   ALBUMIN 4.0 12/26/2023   CALCIUM  10.3 12/26/2023   ANIONGAP 7 01/16/2017   EGFR 61 12/26/2023   Lab Results  Component Value Date   CHOL 109 09/25/2023  Lab Results  Component Value Date   HDL 35 (L) 09/25/2023   Lab Results  Component Value Date   LDLCALC 54 09/25/2023   Lab Results  Component Value Date   TRIG 108 09/25/2023   Lab Results  Component Value Date   CHOLHDL 3.1 09/25/2023   Lab Results  Component Value Date   HGBA1C 6.4 (H) 12/26/2023      Assessment & Plan:   Problem List Items Addressed This Visit       Cardiovascular and Mediastinum   Essential hypertension - Primary   BP Readings from Last 1 Encounters:  12/26/23 128/72   Well-controlled with enalapril  10 mg QD Counseled for compliance with the medications Advised DASH diet and moderate exercise/walking, at least 150  mins/week      Relevant Medications   enalapril  (VASOTEC ) 10 MG tablet   Other Relevant Orders   CBC with Differential/Platelet (Completed)   CMP14+EGFR (Completed)     Endocrine   Type 2 diabetes mellitus with other specified complication (HCC)   Lab Results  Component Value Date   HGBA1C 6.7 (H) 09/25/2023   Associated with HTN, HLD and obesity Well-controlled now- home blood glucose has been 90-120 mostly On Ozempic  1 mg qw and Metformin  500 mg QD Advised to follow diabetic diet On statin and ACEi F/u HbA1c, CMP and lipid panel Diabetic eye exam: Advised to follow up with Ophthalmology for diabetic eye exam      Relevant Medications   enalapril  (VASOTEC ) 10 MG tablet   metFORMIN  (GLUCOPHAGE ) 500 MG tablet   Semaglutide , 1 MG/DOSE, 4 MG/3ML SOPN   Other Relevant Orders   CMP14+EGFR (Completed)   Hemoglobin A1c (Completed)     Other   Hyperlipidemia   On simvastatin  40 mg  QD Checked lipid profile      Relevant Medications   enalapril  (VASOTEC ) 10 MG tablet   Abnormal blood creatinine level   Last BMP showed elevated serum creatinine and decreased GFR (54) Likely due to dehydration Advised to improve fluid intake Recheck CMP      Relevant Orders   CMP14+EGFR (Completed)   Other Visit Diagnoses       Vitamin D  deficiency       Relevant Orders   Vitamin D  (25 hydroxy) (Completed)         Meds ordered this encounter  Medications   enalapril  (VASOTEC ) 10 MG tablet    Sig: Take 1 tablet (10 mg total) by mouth daily.    Dispense:  100 tablet    Refill:  5    Dose change   metFORMIN  (GLUCOPHAGE ) 500 MG tablet    Sig: Take 1 tablet (500 mg total) by mouth daily with breakfast.    Dispense:  100 tablet    Refill:  1   Semaglutide , 1 MG/DOSE, 4 MG/3ML SOPN    Sig: Inject 1 mg as directed once a week.    Dispense:  3 mL    Refill:  5    Follow-up: Return in about 4 months (around 04/27/2024) for HTN and DM.    Meldon Sport, MD

## 2023-12-26 NOTE — Patient Instructions (Addendum)
 Schedule your Medicare Annual Wellness Visit at checkout.  Please continue to take medications as prescribed.  Please continue to follow low carb diet and perform moderate exercise/walking at least 150 mins/week.

## 2023-12-26 NOTE — Assessment & Plan Note (Signed)
 BP Readings from Last 1 Encounters:  12/26/23 128/72   Well-controlled with enalapril  10 mg QD Counseled for compliance with the medications Advised DASH diet and moderate exercise/walking, at least 150 mins/week

## 2023-12-27 ENCOUNTER — Ambulatory Visit: Payer: Self-pay | Admitting: Internal Medicine

## 2023-12-27 ENCOUNTER — Telehealth: Payer: Self-pay

## 2023-12-27 LAB — CBC WITH DIFFERENTIAL/PLATELET
Basophils Absolute: 0.1 10*3/uL (ref 0.0–0.2)
Basos: 1 %
EOS (ABSOLUTE): 0.2 10*3/uL (ref 0.0–0.4)
Eos: 3 %
Hematocrit: 39.2 % (ref 34.0–46.6)
Hemoglobin: 12.1 g/dL (ref 11.1–15.9)
Immature Grans (Abs): 0 10*3/uL (ref 0.0–0.1)
Immature Granulocytes: 0 %
Lymphocytes Absolute: 2.3 10*3/uL (ref 0.7–3.1)
Lymphs: 41 %
MCH: 26.8 pg (ref 26.6–33.0)
MCHC: 30.9 g/dL — ABNORMAL LOW (ref 31.5–35.7)
MCV: 87 fL (ref 79–97)
Monocytes Absolute: 0.3 10*3/uL (ref 0.1–0.9)
Monocytes: 6 %
Neutrophils Absolute: 2.7 10*3/uL (ref 1.4–7.0)
Neutrophils: 49 %
Platelets: 409 10*3/uL (ref 150–450)
RBC: 4.51 x10E6/uL (ref 3.77–5.28)
RDW: 12.8 % (ref 11.7–15.4)
WBC: 5.5 10*3/uL (ref 3.4–10.8)

## 2023-12-27 LAB — VITAMIN D 25 HYDROXY (VIT D DEFICIENCY, FRACTURES): Vit D, 25-Hydroxy: 55.1 ng/mL (ref 30.0–100.0)

## 2023-12-27 LAB — CMP14+EGFR
ALT: 30 IU/L (ref 0–32)
AST: 28 IU/L (ref 0–40)
Albumin: 4 g/dL (ref 3.8–4.8)
Alkaline Phosphatase: 51 IU/L (ref 44–121)
BUN/Creatinine Ratio: 10 — ABNORMAL LOW (ref 12–28)
BUN: 10 mg/dL (ref 8–27)
Bilirubin Total: 0.2 mg/dL (ref 0.0–1.2)
CO2: 25 mmol/L (ref 20–29)
Calcium: 10.3 mg/dL (ref 8.7–10.3)
Chloride: 104 mmol/L (ref 96–106)
Creatinine, Ser: 0.99 mg/dL (ref 0.57–1.00)
Globulin, Total: 2.7 g/dL (ref 1.5–4.5)
Glucose: 94 mg/dL (ref 70–99)
Potassium: 4.7 mmol/L (ref 3.5–5.2)
Sodium: 143 mmol/L (ref 134–144)
Total Protein: 6.7 g/dL (ref 6.0–8.5)
eGFR: 61 mL/min/{1.73_m2} (ref >59)

## 2023-12-27 LAB — HEMOGLOBIN A1C
Est. average glucose Bld gHb Est-mCnc: 137 mg/dL
Hgb A1c MFr Bld: 6.4 % — ABNORMAL HIGH (ref 4.8–5.6)

## 2023-12-27 NOTE — Telephone Encounter (Signed)
 Copied from CRM [id number]. Topic: Clinical - Lab/Test Results >> Dec 27, 2023  8:31 AM [name] wrote: Reason for CRM: The patient returned the call for her lab results. I relayed the note verbatim and she expressed understanding.

## 2023-12-29 NOTE — Assessment & Plan Note (Signed)
 Last BMP showed elevated serum creatinine and decreased GFR (54) Likely due to dehydration Advised to improve fluid intake Recheck CMP

## 2023-12-29 NOTE — Assessment & Plan Note (Addendum)
 On simvastatin  40 mg  QD Checked lipid profile

## 2023-12-30 ENCOUNTER — Other Ambulatory Visit: Payer: Self-pay | Admitting: Internal Medicine

## 2023-12-30 DIAGNOSIS — E119 Type 2 diabetes mellitus without complications: Secondary | ICD-10-CM

## 2024-01-24 ENCOUNTER — Other Ambulatory Visit: Payer: Self-pay | Admitting: Internal Medicine

## 2024-01-24 DIAGNOSIS — E782 Mixed hyperlipidemia: Secondary | ICD-10-CM

## 2024-01-27 ENCOUNTER — Other Ambulatory Visit: Payer: Self-pay | Admitting: Internal Medicine

## 2024-01-27 DIAGNOSIS — E1169 Type 2 diabetes mellitus with other specified complication: Secondary | ICD-10-CM

## 2024-01-31 DIAGNOSIS — E119 Type 2 diabetes mellitus without complications: Secondary | ICD-10-CM | POA: Diagnosis not present

## 2024-02-05 ENCOUNTER — Other Ambulatory Visit: Payer: Self-pay | Admitting: Internal Medicine

## 2024-02-05 DIAGNOSIS — K130 Diseases of lips: Secondary | ICD-10-CM

## 2024-02-05 DIAGNOSIS — L304 Erythema intertrigo: Secondary | ICD-10-CM

## 2024-02-15 ENCOUNTER — Other Ambulatory Visit: Payer: Self-pay | Admitting: Internal Medicine

## 2024-03-05 LAB — MICROALBUMIN / CREATININE URINE RATIO: Microalb Creat Ratio: 30

## 2024-03-19 ENCOUNTER — Encounter: Payer: Self-pay | Admitting: Internal Medicine

## 2024-03-19 ENCOUNTER — Other Ambulatory Visit: Payer: Self-pay

## 2024-03-19 DIAGNOSIS — E1169 Type 2 diabetes mellitus with other specified complication: Secondary | ICD-10-CM

## 2024-03-26 MED ORDER — ONETOUCH ULTRA VI STRP
ORAL_STRIP | 2 refills | Status: DC
Start: 2024-03-26 — End: 2024-03-29

## 2024-03-29 ENCOUNTER — Other Ambulatory Visit: Payer: Self-pay | Admitting: Internal Medicine

## 2024-03-29 DIAGNOSIS — E1169 Type 2 diabetes mellitus with other specified complication: Secondary | ICD-10-CM

## 2024-03-29 MED ORDER — LANCETS MISC. MISC: 1.0000 | Freq: Three times a day (TID) | 3 refills | Status: AC

## 2024-03-29 MED ORDER — BLOOD GLUCOSE MONITORING SUPPL DEVI: 1.0000 | Freq: Three times a day (TID) | 0 refills | Status: DC

## 2024-03-29 MED ORDER — BLOOD GLUCOSE TEST VI STRP: 1.0000 | ORAL_STRIP | Freq: Three times a day (TID) | 3 refills | Status: AC

## 2024-05-06 ENCOUNTER — Ambulatory Visit: Admitting: Internal Medicine

## 2024-05-09 ENCOUNTER — Other Ambulatory Visit: Payer: Self-pay | Admitting: Internal Medicine

## 2024-05-09 DIAGNOSIS — E1169 Type 2 diabetes mellitus with other specified complication: Secondary | ICD-10-CM

## 2024-05-30 ENCOUNTER — Ambulatory Visit: Admitting: Internal Medicine

## 2024-06-19 ENCOUNTER — Other Ambulatory Visit: Payer: Self-pay | Admitting: Internal Medicine

## 2024-06-19 DIAGNOSIS — E1169 Type 2 diabetes mellitus with other specified complication: Secondary | ICD-10-CM

## 2024-07-04 ENCOUNTER — Encounter: Payer: Self-pay | Admitting: Internal Medicine

## 2024-07-04 ENCOUNTER — Ambulatory Visit: Admitting: Internal Medicine

## 2024-07-04 VITALS — BP 136/72 | HR 82 | Resp 16 | Ht 66.0 in | Wt 209.0 lb

## 2024-07-04 DIAGNOSIS — R1903 Right lower quadrant abdominal swelling, mass and lump: Secondary | ICD-10-CM | POA: Insufficient documentation

## 2024-07-04 DIAGNOSIS — E782 Mixed hyperlipidemia: Secondary | ICD-10-CM

## 2024-07-04 DIAGNOSIS — J309 Allergic rhinitis, unspecified: Secondary | ICD-10-CM

## 2024-07-04 DIAGNOSIS — I10 Essential (primary) hypertension: Secondary | ICD-10-CM

## 2024-07-04 DIAGNOSIS — Z23 Encounter for immunization: Secondary | ICD-10-CM

## 2024-07-04 DIAGNOSIS — E1169 Type 2 diabetes mellitus with other specified complication: Secondary | ICD-10-CM

## 2024-07-04 DIAGNOSIS — Z0001 Encounter for general adult medical examination with abnormal findings: Secondary | ICD-10-CM

## 2024-07-04 MED ORDER — SIMVASTATIN 40 MG PO TABS: 40.0000 mg | ORAL_TABLET | Freq: Every day | ORAL | 3 refills | Status: AC

## 2024-07-04 MED ORDER — LEVOCETIRIZINE DIHYDROCHLORIDE 5 MG PO TABS: 5.0000 mg | ORAL_TABLET | Freq: Every evening | ORAL | 5 refills | Status: AC

## 2024-07-04 NOTE — Progress Notes (Unsigned)
 Established Patient Office Visit  Subjective:  Patient ID: [name], female    DOB: 1950-08-16  Age: 73 y.o. MRN: [id number]  CC:  Chief Complaint  Patient presents with   Diabetes    Follow up visit    Mass    Has a lump in the right side of her abdomen about the size of a quarter, is not painful. Has mentioned it before but wanted it checked again    Annual Exam    HPI [name] is a 73 y.o. female with past medical history of HTN, allergic sinusitis, type II DM, osteopenia, HLD and obesity who presents for f/u of her chronic medical conditions.  HTN: BP is wnl today. Takes medications regularly.  Patient denies dizziness, chest pain or palpitations.  Type II DM with HLD: She  has been tolerating Ozempic  1 mg QW dose. She also takes metformin  500 mg QD.  Her HbA1c was 6.4 in 05/25.  Her blood glucose levels have improved to 90-120 mostly since starting Ozempic .  She denies any polyuria or polydipsia.  She takes Zocor  for HLD.  She has noticed a small bump on the right side of the abdomen, which is more painful.  Denies any recent change in size or shape.  Denies any nausea, vomiting, or diarrhea currently.  Denies dysuria, hematuria or urinary hesitancy or resistance.  Past Medical History:  Diagnosis Date   Allergy    Anemia    Hyperlipidemia    Hypertension    Sleep apnea    Type 2 diabetes mellitus (HCC)     Past Surgical History:  Procedure Laterality Date   ABDOMINAL HYSTERECTOMY     fibroids   COLONOSCOPY WITH PROPOFOL  N/A 06/22/2021   Procedure: COLONOSCOPY WITH PROPOFOL ;  Surgeon: Cindie Carlin POUR, DO;  Location: [facility];  Service: Endoscopy;  Laterality: N/A;  1:00 / ASA II   fatty tumor     removed   POLYPECTOMY  06/22/2021   Procedure: POLYPECTOMY;  Surgeon: Cindie Carlin POUR, DO;  Location: [facility];  Service: Endoscopy;;    Family History  Problem Relation Age of Onset   Alzheimer's disease Mother    Cancer Mother         unknown   Alcohol abuse Father    COPD Father    Diabetes Sister    Hypertension Sister    Huntington's disease Daughter     Social History   Socioeconomic History   Marital status: Divorced    Spouse name: Not on file   Number of children: 2   Years of education: Not on file   Highest education level: 11th grade  Occupational History   Occupation: unemployed    Comment: caregiver  Tobacco Use   Smoking status: Former    Current packs/day: 0.00    Types: Cigarettes    Start date: 08/02/1971    Quit date: 08/01/1986    Years since quitting: 37.9   Smokeless tobacco: Never  Vaping Use   Vaping status: Never Used  Substance and Sexual Activity   Alcohol use: No   Drug use: No   Sexual activity: Never    Birth control/protection: Surgical  Other Topics Concern   Not on file  Social History Narrative   Lives with disabled mother with Alzheimer's   Daughter with Hungtington's passed away 08-Sep-2019.   Separated for 15 years.Lives with kids and mother.Retired.Previously in banker.   Social Drivers of Dispensing Optician  Resource Strain: Low Risk  (11/28/2022)   Overall Financial Resource Strain (CARDIA)    Difficulty of Paying Living Expenses: Not hard at all  Food Insecurity: No Food Insecurity (11/28/2022)   Hunger Vital Sign    Worried About Running Out of Food in the Last Year: Never true    Ran Out of Food in the Last Year: Never true  Transportation Needs: No Transportation Needs (11/28/2022)   PRAPARE - Administrator, Civil Service (Medical): No    Lack of Transportation (Non-Medical): No  Physical Activity: Inactive (11/28/2022)   Exercise Vital Sign    Days of Exercise per Week: 0 days    Minutes of Exercise per Session: 0 min  Stress: No Stress Concern Present (11/28/2022)   [employer] of Occupational Health - Occupational Stress Questionnaire    Feeling of Stress : Not at all  Social Connections: Moderately Integrated (11/28/2022)    Social Connection and Isolation Panel    Frequency of Communication with Friends and Family: Twice a week    Frequency of Social Gatherings with Friends and Family: Once a week    Attends Religious Services: More than 4 times per year    Active Member of [employer] or Organizations: Yes    Attends Engineer, Structural: More than 4 times per year    Marital Status: Divorced  Intimate Partner Violence: Not At Risk (11/28/2022)   Humiliation, Afraid, Rape, and Kick questionnaire    Fear of Current or Ex-Partner: No    Emotionally Abused: No    Physically Abused: No    Sexually Abused: No    Outpatient Medications Prior to Visit  Medication Sig Dispense Refill   amoxicillin (AMOXIL) 500 MG capsule Take 500 mg by mouth 3 (three) times daily.     aspirin 81 MG tablet Take 81 mg by mouth daily.     Blood Glucose Monitoring Suppl DEVI 1 each by Does not apply route in the morning, at noon, and at bedtime. May substitute to any manufacturer covered by patient's insurance. 1 each 0   Cholecalciferol (VITAMIN D3) 50 MCG (2000 UT) TABS Take 2,000 Units by mouth daily.     enalapril  (VASOTEC ) 10 MG tablet Take 1 tablet (10 mg total) by mouth daily. 100 tablet 5   fluticasone  (FLONASE ) 50 MCG/ACT nasal spray Place 2 sprays into both nostrils daily. 16 g 6   Glucose Blood (BLOOD GLUCOSE TEST STRIPS) STRP 1 each by In Vitro route in the morning, at noon, and at bedtime. May substitute to any manufacturer covered by patient's insurance. 100 strip 3   ketoconazole  (NIZORAL ) 2 % cream APPLY  CREAM TOPICALLY ONCE DAILY 15 g 0   Lancets Misc. MISC 1 each by Does not apply route in the morning, at noon, and at bedtime. May substitute to any manufacturer covered by patient's insurance. 100 each 3   metFORMIN  (GLUCOPHAGE ) 500 MG tablet TAKE 1 TABLET BY MOUTH TWICE DAILY WITH A MEAL 200 tablet 0   Multiple Vitamin (MULTIVITAMINS PO) Take 1 tablet by mouth daily.     nystatin  (MYCOSTATIN /NYSTOP ) powder APPLY   POWDER TOPICALLY THREE TIMES DAILY 60 g 0   Omega-3 Fatty Acids (FISH OIL) 1000 MG CAPS Take 2,000 mg by mouth 2 (two) times daily.     Semaglutide , 1 MG/DOSE, (OZEMPIC , 1 MG/DOSE,) 4 MG/3ML SOPN INJECT 1 MG SUBCUTANEOUSLY ONCE A WEEK 3 mL 5   chlorpheniramine (CHLOR-TRIMETON) 4 MG tablet Take 4 mg by mouth daily.  Lancets (ONETOUCH DELICA PLUS LANCET33G) MISC USE   TO CHECK GLUCOSE ONCE DAILY 100 each 0   levocetirizine (XYZAL ) 5 MG tablet Take 1 tablet (5 mg total) by mouth every evening. 30 tablet 3   mupirocin  ointment (BACTROBAN ) 2 % APPLY  OINTMENT TOPICALLY TWICE DAILY 22 g 0   simvastatin  (ZOCOR ) 40 MG tablet TAKE 1 TABLET BY MOUTH ONCE DAILY AT  6  PM 90 tablet 0   No facility-administered medications prior to visit.    Allergies  Allergen Reactions   Sulfonamide Derivatives Other (See Comments)    Does not recall reaction     ROS Review of Systems  Constitutional:  Negative for chills and fever.  HENT:  Negative for congestion, sinus pressure and sinus pain.   Eyes:  Negative for pain and discharge.  Respiratory:  Negative for cough and wheezing.   Cardiovascular:  Negative for chest pain and palpitations.  Gastrointestinal:  Negative for abdominal pain, diarrhea, nausea and vomiting.  Endocrine: Negative for polydipsia and polyuria.  Genitourinary:  Negative for dysuria and hematuria.  Musculoskeletal:  Negative for neck pain and neck stiffness.       Left hip and leg pain  Skin:  Negative for rash.       Mole over abdominal wall  Neurological:  Negative for dizziness and weakness.  Psychiatric/Behavioral:  Negative for agitation and behavioral problems.       Objective:    Physical Exam Vitals reviewed.  Constitutional:      General: She is not in acute distress.    Appearance: She is not diaphoretic.  HENT:     Head: Normocephalic and atraumatic.     Nose: No congestion.     Mouth/Throat:     Mouth: Mucous membranes are moist.  Eyes:     General: No  scleral icterus.    Extraocular Movements: Extraocular movements intact.  Cardiovascular:     Rate and Rhythm: Normal rate and regular rhythm.     Heart sounds: Normal heart sounds. No murmur heard. Pulmonary:     Breath sounds: Normal breath sounds. No wheezing or rales.  Abdominal:     Palpations: Abdomen is soft.     Tenderness: There is no abdominal tenderness.     Comments: About 0.5-1 cm in diameter nontender mass in RLQ area  Musculoskeletal:     Cervical back: Neck supple. No tenderness.     Right lower leg: No edema.     Left lower leg: No edema.  Skin:    General: Skin is warm.     Findings: No rash.     Comments: 2 brownish moles with clear margin on left side of abdominal wall  Neurological:     General: No focal deficit present.     Mental Status: She is alert and oriented to person, place, and time.     Cranial Nerves: No cranial nerve deficit.     Sensory: No sensory deficit.     Motor: No weakness.  Psychiatric:        Mood and Affect: Mood normal.        Behavior: Behavior normal.     BP 136/72   Pulse 82   Resp 16   Ht 5' 6 (1.676 m)   Wt 209 lb (94.8 kg)   SpO2 100%   BMI 33.73 kg/m  Wt Readings from Last 3 Encounters:  07/04/24 209 lb (94.8 kg)  12/26/23 207 lb 6.4 oz (94.1 kg)  09/25/23 209 lb  3.2 oz (94.9 kg)    Lab Results  Component Value Date   TSH 1.380 04/20/2022   Lab Results  Component Value Date   WBC 5.5 12/26/2023   HGB 12.1 12/26/2023   HCT 39.2 12/26/2023   MCV 87 12/26/2023   PLT 409 12/26/2023   Lab Results  Component Value Date   NA 145 (H) 07/04/2024   K 4.3 07/04/2024   CO2 26 07/04/2024   GLUCOSE 88 07/04/2024   BUN 10 07/04/2024   CREATININE 0.95 07/04/2024   BILITOT 0.3 07/04/2024   ALKPHOS 57 07/04/2024   AST 19 07/04/2024   ALT 15 07/04/2024   PROT 7.1 07/04/2024   ALBUMIN 4.1 07/04/2024   CALCIUM  10.1 07/04/2024   ANIONGAP 7 01/16/2017   EGFR 64 07/04/2024   Lab Results  Component Value Date    CHOL 109 09/25/2023   Lab Results  Component Value Date   HDL 35 (L) 09/25/2023   Lab Results  Component Value Date   LDLCALC 54 09/25/2023   Lab Results  Component Value Date   TRIG 108 09/25/2023   Lab Results  Component Value Date   CHOLHDL 3.1 09/25/2023   Lab Results  Component Value Date   HGBA1C 6.3 (H) 07/04/2024      Assessment & Plan:   Problem List Items Addressed This Visit       Cardiovascular and Mediastinum   Essential hypertension   BP Readings from Last 1 Encounters:  07/04/24 136/72   Well-controlled with enalapril  10 mg QD Counseled for compliance with the medications Advised DASH diet and moderate exercise/walking, at least 150 mins/week      Relevant Medications   simvastatin  (ZOCOR ) 40 MG tablet     Respiratory   Allergic sinusitis   Flonase  as needed for nasal congestion/allergies Refilled Xyzal  Advised to use humidifier at nighttime.      Relevant Medications   levocetirizine (XYZAL ) 5 MG tablet     Endocrine   Type 2 diabetes mellitus with other specified complication (HCC)   Lab Results  Component Value Date   HGBA1C 6.4 (H) 12/26/2023   Associated with HTN, HLD and obesity Well-controlled now- home blood glucose has been 90-120 mostly On Ozempic  1 mg qw and Metformin  500 mg QD Advised to follow diabetic diet On statin and ACEi F/u HbA1c, CMP and lipid panel Diabetic eye exam: Advised to follow up with Ophthalmology for diabetic eye exam      Relevant Medications   simvastatin  (ZOCOR ) 40 MG tablet   Other Relevant Orders   CMP14+EGFR (Completed)   Hemoglobin A1c (Completed)     Other   Hyperlipidemia   On simvastatin  40 mg  QD Checked lipid profile      Relevant Medications   simvastatin  (ZOCOR ) 40 MG tablet   Encounter for general adult medical examination with abnormal findings - Primary   Physical exam as documented. Counseling done  re healthy lifestyle involving commitment to 150 minutes exercise per  week, heart healthy diet, and attaining healthy weight.The importance of adequate sleep also discussed. Immunization and cancer screening needs are specifically addressed at this visit.      Right lower quadrant abdominal mass   Reports noticing RLQ abdominal mass No apparent mass noted - has probable 0.5-1 cm small bump, which could be a lymph node vs lipoma (has history of lipoma) Advised to contact if local pain or enlarging mass - may consider imaging      Other Visit Diagnoses  Encounter for immunization       Relevant Orders   Flu vaccine HIGH DOSE PF(Fluzone Trivalent) (Completed)          Meds ordered this encounter  Medications   simvastatin  (ZOCOR ) 40 MG tablet    Sig: Take 1 tablet (40 mg total) by mouth daily at 6 PM.    Dispense:  90 tablet    Refill:  3   levocetirizine (XYZAL ) 5 MG tablet    Sig: Take 1 tablet (5 mg total) by mouth every evening.    Dispense:  30 tablet    Refill:  5    Follow-up: Return in about 6 months (around 01/02/2025) for DM and HTN.    Suzzane MARLA Blanch, MD

## 2024-07-04 NOTE — Patient Instructions (Signed)
 Please continue to take medications as prescribed.  Please continue to follow low carb diet and perform moderate exercise/walking at least 150 mins/week.

## 2024-07-04 NOTE — Assessment & Plan Note (Signed)
 Lab Results  Component Value Date   HGBA1C 6.4 (H) 12/26/2023   Associated with HTN, HLD and obesity Well-controlled now- home blood glucose has been 90-120 mostly On Ozempic  1 mg qw and Metformin  500 mg QD Advised to follow diabetic diet On statin and ACEi F/u HbA1c, CMP and lipid panel Diabetic eye exam: Advised to follow up with Ophthalmology for diabetic eye exam

## 2024-07-04 NOTE — Assessment & Plan Note (Signed)
 Flonase  as needed for nasal congestion/allergies Refilled Xyzal  Advised to use humidifier at nighttime.

## 2024-07-04 NOTE — Assessment & Plan Note (Signed)
 BP Readings from Last 1 Encounters:  07/04/24 136/72   Well-controlled with enalapril  10 mg QD Counseled for compliance with the medications Advised DASH diet and moderate exercise/walking, at least 150 mins/week

## 2024-07-04 NOTE — Assessment & Plan Note (Signed)
 On simvastatin  40 mg  QD Checked lipid profile

## 2024-07-04 NOTE — Assessment & Plan Note (Signed)
 Physical exam as documented. Counseling done  re healthy lifestyle involving commitment to 150 minutes exercise per week, heart healthy diet, and attaining healthy weight.The importance of adequate sleep also discussed. Immunization and cancer screening needs are specifically addressed at this visit.

## 2024-07-04 NOTE — Assessment & Plan Note (Signed)
 Reports noticing RLQ abdominal mass No apparent mass noted - has probable 0.5-1 cm small bump, which could be a lymph node vs lipoma (has history of lipoma) Advised to contact if local pain or enlarging mass - may consider imaging

## 2024-07-05 ENCOUNTER — Ambulatory Visit: Payer: Self-pay | Admitting: Internal Medicine

## 2024-07-05 LAB — CMP14+EGFR
ALT: 15 IU/L (ref 0–32)
AST: 19 IU/L (ref 0–40)
Albumin: 4.1 g/dL (ref 3.8–4.8)
Alkaline Phosphatase: 57 IU/L (ref 49–135)
BUN/Creatinine Ratio: 11 — ABNORMAL LOW (ref 12–28)
BUN: 10 mg/dL (ref 8–27)
Bilirubin Total: 0.3 mg/dL (ref 0.0–1.2)
CO2: 26 mmol/L (ref 20–29)
Calcium: 10.1 mg/dL (ref 8.7–10.3)
Chloride: 106 mmol/L (ref 96–106)
Creatinine, Ser: 0.95 mg/dL (ref 0.57–1.00)
Globulin, Total: 3 g/dL (ref 1.5–4.5)
Glucose: 88 mg/dL (ref 70–99)
Potassium: 4.3 mmol/L (ref 3.5–5.2)
Sodium: 145 mmol/L — ABNORMAL HIGH (ref 134–144)
Total Protein: 7.1 g/dL (ref 6.0–8.5)
eGFR: 64 mL/min/1.73 (ref >59)

## 2024-07-05 LAB — HEMOGLOBIN A1C
Est. average glucose Bld gHb Est-mCnc: 134 mg/dL
Hgb A1c MFr Bld: 6.3 % — ABNORMAL HIGH (ref 4.8–5.6)

## 2024-07-09 ENCOUNTER — Ambulatory Visit

## 2024-07-09 VITALS — Ht 66.0 in | Wt 209.0 lb

## 2024-07-09 DIAGNOSIS — Z1231 Encounter for screening mammogram for malignant neoplasm of breast: Secondary | ICD-10-CM

## 2024-07-09 DIAGNOSIS — Z0000 Encounter for general adult medical examination without abnormal findings: Secondary | ICD-10-CM

## 2024-07-09 NOTE — Patient Instructions (Signed)
 [name],  Thank you for taking the time for your Medicare Wellness Visit. I appreciate your continued commitment to your health goals. Please review the care plan we discussed, and feel free to reach out if I can assist you further.  Please note that Annual Wellness Visits do not include a physical exam. Some assessments may be limited, especially if the visit was conducted virtually. If needed, we may recommend an in-person follow-up with your provider.  Ongoing Care Seeing your primary care provider every 3 to 6 months helps us  monitor your health and provide consistent, personalized care.   Next office visit with primary care provider: January 02, 2025 at 3:40pm  1 year follow up for Medicare well visit: July 11, 2025 at 11:20am with medicare wellness nurse in office  Referrals If a referral was made during today's visit and you haven't received any updates within two weeks, please contact the referred provider directly to check on the status.  Mammogram at [facility] Call [phone] to schedule your screening No perfumes, lotions, or deodorants the day of your screening. You can schedule your mammogram through mychart!   Recommended Screenings:  Health Maintenance  Topic Date Due   Zoster (Shingles) Vaccine (1 of 2) Never done   Medicare Annual Wellness Visit  11/28/2023   Breast Cancer Screening  12/14/2023   COVID-19 Vaccine (4 - 2025-26 season) 04/01/2024   Complete foot exam   09/24/2024   Hemoglobin A1C  01/02/2025   Eye exam for diabetics  01/30/2025   Yearly kidney health urinalysis for diabetes  03/05/2025   Yearly kidney function blood test for diabetes  07/04/2025   Osteoporosis screening with Bone Density Scan  12/13/2025   Colon Cancer Screening  06/22/2026   DTaP/Tdap/Td vaccine (2 - Td or Tdap) 05/28/2029   Pneumococcal Vaccine for age over 81  Completed   Flu Shot  Completed   Hepatitis C Screening  Completed   Meningitis B Vaccine  Aged Out        07/09/2024    4:53 PM  Advanced Directives  Does Patient Have a Medical Advance Directive? No  Would patient like information on creating a medical advance directive? Yes (MAU/Ambulatory/Procedural Areas - Information given)   Vision: Annual vision screenings are recommended for early detection of glaucoma, cataracts, and diabetic retinopathy. These exams can also reveal signs of chronic conditions such as diabetes and high blood pressure.  Dental: Annual dental screenings help detect early signs of oral cancer, gum disease, and other conditions linked to overall health, including heart disease and diabetes.  Please see the attached documents for additional preventive care recommendations.

## 2024-07-09 NOTE — Progress Notes (Signed)
 No chief complaint on file.    Subjective:   [name] is a 73 y.o. female who presents for a Medicare Annual Wellness Visit.  Visit info / Clinical Intake: Medicare Wellness Visit Type:: Subsequent Annual Wellness Visit Persons participating in visit and providing information:: patient Medicare Wellness Visit Mode:: Telephone If telephone:: video declined Since this visit was completed virtually, some vitals may be partially provided or unavailable. Missing vitals are due to the limitations of the virtual format.: Documented vitals are patient reported If Telephone or Video please confirm:: I connected with patient using audio/video enable telemedicine. I verified patient identity with two identifiers, discussed telehealth limitations, and patient agreed to proceed. Patient Location:: home Provider Location:: home office Interpreter Needed?: No Pre-visit prep was completed: yes AWV questionnaire completed by patient prior to visit?: no Living arrangements:: with family/others Patient's Overall Health Status Rating: good Typical amount of pain: none Does pain affect daily life?: no Are you currently prescribed opioids?: no  Dietary Habits and Nutritional Risks How many meals a day?: 3 Eats fruit and vegetables daily?: yes Most meals are obtained by: preparing own meals In the last 2 weeks, have you had any of the following?: none Diabetic:: (!) yes Any non-healing wounds?: no How often do you check your BS?: 1 Would you like to be referred to a Nutritionist or for Diabetic Management? : no  Functional Status Activities of Daily Living (to include ambulation/medication): Independent Ambulation: Independent Medication Administration: Independent Home Management (perform basic housework or laundry): Independent Manage your own finances?: yes Primary transportation is: driving Concerns about vision?: no *vision screening is required for WTM* Concerns about  hearing?: no  Fall Screening Falls in the past year?: 0 Number of falls in past year: 0 Was there an injury with Fall?: 0 Fall Risk Category Calculator: 0 Patient Fall Risk Level: Low Fall Risk  Fall Risk Patient at Risk for Falls Due to: No Fall Risks Fall risk Follow up: Falls evaluation completed; Education provided; Falls prevention discussed  Home and Transportation Safety: All rugs have non-skid backing?: yes All stairs or steps have railings?: yes Grab bars in the bathtub or shower?: (!) no Have non-skid surface in bathtub or shower?: yes Good home lighting?: yes Regular seat belt use?: yes Hospital stays in the last year:: no  Cognitive Assessment Difficulty concentrating, remembering, or making decisions? : no Will 6CIT or Mini Cog be Completed: no 6CIT or Mini Cog Declined: patient alert, oriented, able to answer questions appropriately and recall recent events  Advance Directives (For Healthcare) Does Patient Have a Medical Advance Directive?: No Would patient like information on creating a medical advance directive?: Yes (MAU/Ambulatory/Procedural Areas - Information given)  Reviewed/Updated  Reviewed/Updated: Reviewed All (Medical, Surgical, Family, Medications, Allergies, Care Teams, Patient Goals)    Allergies (verified) Sulfonamide derivatives   Current Medications (verified) Outpatient Encounter Medications as of 07/09/2024  Medication Sig   amoxicillin (AMOXIL) 500 MG capsule Take 500 mg by mouth 3 (three) times daily.   aspirin 81 MG tablet Take 81 mg by mouth daily.   Blood Glucose Monitoring Suppl DEVI 1 each by Does not apply route in the morning, at noon, and at bedtime. May substitute to any manufacturer covered by patient's insurance.   Cholecalciferol (VITAMIN D3) 50 MCG (2000 UT) TABS Take 2,000 Units by mouth daily.   enalapril  (VASOTEC ) 10 MG tablet Take 1 tablet (10 mg total) by mouth daily.   fluticasone  (FLONASE ) 50 MCG/ACT nasal spray  Place  2 sprays into both nostrils daily.   Glucose Blood (BLOOD GLUCOSE TEST STRIPS) STRP 1 each by In Vitro route in the morning, at noon, and at bedtime. May substitute to any manufacturer covered by patient's insurance.   ketoconazole  (NIZORAL ) 2 % cream APPLY  CREAM TOPICALLY ONCE DAILY   Lancets Misc. MISC 1 each by Does not apply route in the morning, at noon, and at bedtime. May substitute to any manufacturer covered by patient's insurance.   levocetirizine (XYZAL ) 5 MG tablet Take 1 tablet (5 mg total) by mouth every evening.   metFORMIN  (GLUCOPHAGE ) 500 MG tablet TAKE 1 TABLET BY MOUTH TWICE DAILY WITH A MEAL   Multiple Vitamin (MULTIVITAMINS PO) Take 1 tablet by mouth daily.   nystatin  (MYCOSTATIN /NYSTOP ) powder APPLY  POWDER TOPICALLY THREE TIMES DAILY   Omega-3 Fatty Acids (FISH OIL) 1000 MG CAPS Take 2,000 mg by mouth 2 (two) times daily.   Semaglutide , 1 MG/DOSE, (OZEMPIC , 1 MG/DOSE,) 4 MG/3ML SOPN INJECT 1 MG SUBCUTANEOUSLY ONCE A WEEK   simvastatin  (ZOCOR ) 40 MG tablet Take 1 tablet (40 mg total) by mouth daily at 6 PM.   No facility-administered encounter medications on file as of 07/09/2024.    History: Past Medical History:  Diagnosis Date   Allergy    Anemia    Hyperlipidemia    Hypertension    Sleep apnea    Type 2 diabetes mellitus (HCC)    Past Surgical History:  Procedure Laterality Date   ABDOMINAL HYSTERECTOMY     fibroids   COLONOSCOPY WITH PROPOFOL  N/A 06/22/2021   Procedure: COLONOSCOPY WITH PROPOFOL ;  Surgeon: Cindie Carlin POUR, DO;  Location: [facility];  Service: Endoscopy;  Laterality: N/A;  1:00 / ASA II   fatty tumor     removed   POLYPECTOMY  06/22/2021   Procedure: POLYPECTOMY;  Surgeon: Cindie Carlin POUR, DO;  Location: [facility];  Service: Endoscopy;;   Family History  Problem Relation Age of Onset   Alzheimer's disease Mother    Cancer Mother        unknown   Alcohol abuse Father    COPD Father    Diabetes Sister     Hypertension Sister    Huntington's disease Daughter    Social History   Occupational History   Occupation: unemployed    Comment: caregiver  Tobacco Use   Smoking status: Former    Current packs/day: 0.00    Types: Cigarettes    Start date: 08/02/1971    Quit date: 08/01/1986    Years since quitting: 37.9   Smokeless tobacco: Never  Vaping Use   Vaping status: Never Used  Substance and Sexual Activity   Alcohol use: No   Drug use: No   Sexual activity: Never    Birth control/protection: Surgical   Tobacco Counseling Counseling given: Yes  SDOH Screenings   Food Insecurity: Food Insecurity Present (07/09/2024)  Housing: Low Risk  (07/09/2024)  Transportation Needs: No Transportation Needs (07/09/2024)  Utilities: Not At Risk (07/09/2024)  Alcohol Screen: Low Risk  (11/28/2022)  Depression (PHQ2-9): Low Risk  (07/09/2024)  Financial Resource Strain: Low Risk  (11/28/2022)  Physical Activity: Inactive (07/09/2024)  Social Connections: Moderately Isolated (07/09/2024)  Stress: No Stress Concern Present (07/09/2024)  Tobacco Use: Medium Risk (07/09/2024)  Health Literacy: Adequate Health Literacy (07/09/2024)   See flowsheets for full screening details  Depression Screen PHQ 2 & 9 Depression Scale- Over the past 2 weeks, how often have you been bothered by any  of the following problems? Little interest or pleasure in doing things: 0 Feeling down, depressed, or hopeless (PHQ Adolescent also includes...irritable): 0 PHQ-2 Total Score: 0 Trouble falling or staying asleep, or sleeping too much: 0 Feeling tired or having little energy: 0 Poor appetite or overeating (PHQ Adolescent also includes...weight loss): 0 Feeling bad about yourself - or that you are a failure or have let yourself or your family down: 0 Trouble concentrating on things, such as reading the newspaper or watching television (PHQ Adolescent also includes...like school work): 0 Moving or speaking so slowly that other  people could have noticed. Or the opposite - being so fidgety or restless that you have been moving around a lot more than usual: 0 Thoughts that you would be better off dead, or of hurting yourself in some way: 0 PHQ-9 Total Score: 0 If you checked off any problems, how difficult have these problems made it for you to do your work, take care of things at home, or get along with other people?: Not difficult at all  Depression Treatment Depression Interventions/Treatment : [id number] Score <4 Follow-up Not Indicated     Goals Addressed   None          Objective:    Today's Vitals   07/09/24 1657  Weight: 209 lb (94.8 kg)  Height: 5' 6 (1.676 m)   Body mass index is 33.73 kg/m.  Hearing/Vision screen Hearing Screening - Comments:: Patient denies any hearing difficulties.   Vision Screening - Comments:: Wears rx glasses - up to date with routine eye exams with Oneil Kawasaki  Immunizations and Health Maintenance Health Maintenance  Topic Date Due   Zoster Vaccines- Shingrix (1 of 2) Never done   Medicare Annual Wellness (AWV)  11/28/2023   Mammogram  12/14/2023   COVID-19 Vaccine (4 - 2025-26 season) 04/01/2024   FOOT EXAM  09/24/2024   HEMOGLOBIN A1C  01/02/2025   OPHTHALMOLOGY EXAM  01/30/2025   Diabetic kidney evaluation - Urine ACR  03/05/2025   Diabetic kidney evaluation - eGFR measurement  07/04/2025   Bone Density Scan  12/13/2025   Colonoscopy  06/22/2026   DTaP/Tdap/Td (2 - Td or Tdap) 05/28/2029   Pneumococcal Vaccine: 50+ Years  Completed   Influenza Vaccine  Completed   Hepatitis C Screening  Completed   Meningococcal B Vaccine  Aged Out        Assessment/Plan:  This is a routine wellness examination for Taiya.  Patient Care Team: Tobie Suzzane POUR, MD as PCP - General (Internal Medicine) Shaaron Lamar HERO, MD as Consulting Physician (Gastroenterology) Debera Jayson MATSU, MD as Consulting Physician (Cardiology)  I have personally reviewed and noted the  following in the patient's chart:   Medical and social history Use of alcohol, tobacco or illicit drugs  Current medications and supplements including opioid prescriptions. Functional ability and status Nutritional status Physical activity Advanced directives List of other physicians Hospitalizations, surgeries, and ER visits in previous 12 months Vitals Screenings to include cognitive, depression, and falls Referrals and appointments  Orders Placed This Encounter  Procedures   MM 3D SCREENING MAMMOGRAM BILATERAL BREAST    Standing Status:   Future    Expiration Date:   07/09/2025    Reason for Exam (SYMPTOM  OR DIAGNOSIS REQUIRED):   breast cancer screening    Preferred imaging location?:   [facility]   In addition, I have reviewed and discussed with patient certain preventive protocols, quality metrics, and best practice recommendations. A written personalized  care plan for preventive services as well as general preventive health recommendations were provided to patient.   Raybon Conard, CMA   07/09/2024   Return on July 11, 2025 at 11:20am, for your yearly Medicare Wellness Visit in person.  After Visit Summary: (Mail) Due to this being a telephonic visit, the after visit summary with patients personalized plan was offered to patient via mail   No voiced or noted concerns at this time

## 2024-08-28 ENCOUNTER — Other Ambulatory Visit: Payer: Self-pay | Admitting: Internal Medicine

## 2024-08-28 DIAGNOSIS — E1169 Type 2 diabetes mellitus with other specified complication: Secondary | ICD-10-CM

## 2025-01-02 ENCOUNTER — Ambulatory Visit: Admitting: Internal Medicine

## 2025-07-11 ENCOUNTER — Ambulatory Visit
